# Patient Record
Sex: Female | Born: 1990 | Race: Asian | Hispanic: No | Smoking: Never smoker
Health system: Southern US, Community
[De-identification: ages and names within clinical notes are randomized; demographics above are authoritative.]

## PROBLEM LIST (undated history)

## (undated) DIAGNOSIS — Z789 Other specified health status: Secondary | ICD-10-CM

## (undated) HISTORY — PX: NO PAST SURGERIES: SHX2092

---
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
## 2012-06-08 LAB — OB RESULTS CONSOLE RPR: RPR: NONREACTIVE

## 2012-06-08 LAB — OB RESULTS CONSOLE HEPATITIS B SURFACE ANTIGEN: Hepatitis B Surface Ag: NEGATIVE

## 2012-06-08 LAB — OB RESULTS CONSOLE GC/CHLAMYDIA: Chlamydia: NEGATIVE

## 2012-09-05 NOTE — L&D Delivery Note (Signed)
Delivery Note At 8:35 PM a viable female was delivered via  (Presentation: ;  ).  APGAR: , ; weight .   Placenta status: , .  Cord:  with the following complications: .  Cord pH: not done  Anesthesia:   Episiotomy:  Lacerations:  Suture Repair: 2.0 vicryl Est. Blood Loss (mL):   Mom to postpartum.  Baby to nursery-stable.  Fumio Vandam A 12/10/2012, 8:52 PM

## 2012-11-21 ENCOUNTER — Encounter: Payer: Self-pay | Admitting: Obstetrics

## 2012-11-21 ENCOUNTER — Ambulatory Visit (INDEPENDENT_AMBULATORY_CARE_PROVIDER_SITE_OTHER): Payer: Medicaid Other | Admitting: Obstetrics

## 2012-11-21 DIAGNOSIS — Z3480 Encounter for supervision of other normal pregnancy, unspecified trimester: Secondary | ICD-10-CM

## 2012-11-21 DIAGNOSIS — Z3403 Encounter for supervision of normal first pregnancy, third trimester: Secondary | ICD-10-CM

## 2012-11-21 DIAGNOSIS — Z3400 Encounter for supervision of normal first pregnancy, unspecified trimester: Secondary | ICD-10-CM

## 2012-11-21 LAB — POCT URINALYSIS DIPSTICK
Bilirubin, UA: NEGATIVE
Blood, UA: NEGATIVE
Ketones, UA: NEGATIVE
Leukocytes, UA: NEGATIVE
Spec Grav, UA: 1.015
pH, UA: 8

## 2012-11-21 MED ORDER — LORATADINE-PSEUDOEPHEDRINE ER 5-120 MG PO TB12: 1.0000 | ORAL_TABLET | Freq: Two times a day (BID) | ORAL | Status: DC

## 2012-11-28 ENCOUNTER — Ambulatory Visit (INDEPENDENT_AMBULATORY_CARE_PROVIDER_SITE_OTHER): Payer: Medicaid Other | Admitting: Obstetrics

## 2012-11-28 ENCOUNTER — Encounter: Payer: Self-pay | Admitting: Obstetrics

## 2012-11-28 DIAGNOSIS — Z3400 Encounter for supervision of normal first pregnancy, unspecified trimester: Secondary | ICD-10-CM

## 2012-11-28 DIAGNOSIS — Z3403 Encounter for supervision of normal first pregnancy, third trimester: Secondary | ICD-10-CM

## 2012-12-05 ENCOUNTER — Ambulatory Visit (INDEPENDENT_AMBULATORY_CARE_PROVIDER_SITE_OTHER): Payer: Medicaid Other | Admitting: Obstetrics & Gynecology

## 2012-12-05 ENCOUNTER — Encounter: Payer: Self-pay | Admitting: Obstetrics & Gynecology

## 2012-12-05 DIAGNOSIS — Z3403 Encounter for supervision of normal first pregnancy, third trimester: Secondary | ICD-10-CM

## 2012-12-05 DIAGNOSIS — Z3400 Encounter for supervision of normal first pregnancy, unspecified trimester: Secondary | ICD-10-CM

## 2012-12-05 NOTE — Progress Notes (Signed)
C/O BRBPR w/BM.

## 2012-12-05 NOTE — Patient Instructions (Signed)
Hemorrhoids  Hemorrhoids are veins in the rectum that get big. These veins can get blocked. Blocked veins become puffy (swollen) and painful.  HOME CARE   Eat more fiber.   Drink enough fluid to keep your pee (urine) clear or pale yellow.   Exercise often.   Avoid straining to poop (bowel movement).   Keep the butt area dry and clean.   Only take medicine as told by your doctor.  If your hemorrhoids are puffy and painful:   Take a warm bath for 20 to 30 minutes. Do this 3 to 4 times a day.   Place ice packs on the area. Use the ice packs between the baths.   Put ice in a plastic bag.   Place a towel between your skin and the bag.   Leave the ice on for 15 to 20 minutes, 3 to 4 times a day.   Do not use a donut-shaped pillow. Do not sit on the toilet for a long time.   Go to the bathroom when your body has the urge to poop. This is so you do not strain as much to poop.  GET HELP RIGHT AWAY IF:     You have increasing pain that is not controlled with medicine.   You have uncontrolled bleeding.   You cannot poop.   You have pain or puffiness outside the area of the hemorrhoids.   You have chills.   You have a temperature by mouth above 102 F (38.9 C), not controlled by medicine.  MAKE SURE YOU:     Understand these instructions.   Will watch your condition.   Will get help right away if you are not doing well or get worse.  Document Released: 05/31/2008 Document Revised: 11/14/2011 Document Reviewed: 05/31/2008  [employer] Patient Information 2013 [employer].

## 2012-12-10 ENCOUNTER — Encounter (HOSPITAL_COMMUNITY): Payer: Self-pay | Admitting: *Deleted

## 2012-12-10 ENCOUNTER — Inpatient Hospital Stay (HOSPITAL_COMMUNITY)
Admission: AD | Admit: 2012-12-10 | Discharge: 2012-12-12 | DRG: 775 | Disposition: A | Discharge status: Home / Self Care | Payer: Medicaid Other | Source: Ambulatory Visit | Attending: Obstetrics & Gynecology | Admitting: Obstetrics & Gynecology

## 2012-12-10 DIAGNOSIS — IMO0001 Reserved for inherently not codable concepts without codable children: Secondary | ICD-10-CM

## 2012-12-10 HISTORY — DX: Other specified health status: Z78.9

## 2012-12-10 LAB — CBC
Hemoglobin: 12.1 g/dL (ref 12.0–15.0)
MCH: 22.8 pg — ABNORMAL LOW (ref 26.0–34.0)
RBC: 5.3 MIL/uL — ABNORMAL HIGH (ref 3.87–5.11)

## 2012-12-10 LAB — RPR: RPR Ser Ql: NONREACTIVE

## 2012-12-10 LAB — ABO/RH: ABO/RH(D): O POS

## 2012-12-10 LAB — TYPE AND SCREEN
ABO/RH(D): O POS
Antibody Screen: NEGATIVE

## 2012-12-10 MED ORDER — ZOLPIDEM TARTRATE 5 MG PO TABS: 5.0000 mg | ORAL_TABLET | Freq: Every evening | ORAL | Status: DC | PRN

## 2012-12-10 MED ORDER — SIMETHICONE 80 MG PO CHEW: 80.0000 mg | CHEWABLE_TABLET | ORAL | Status: DC | PRN

## 2012-12-10 MED ORDER — OXYCODONE-ACETAMINOPHEN 5-325 MG PO TABS
1.0000 | ORAL_TABLET | ORAL | Status: DC | PRN
  Administered 2012-12-11: 1 via ORAL

## 2012-12-10 MED ORDER — ACETAMINOPHEN 325 MG PO TABS: 650.0000 mg | ORAL_TABLET | ORAL | Status: DC | PRN

## 2012-12-10 MED ORDER — DIBUCAINE 1 % RE OINT: 1.0000 "application " | TOPICAL_OINTMENT | RECTAL | Status: DC | PRN

## 2012-12-10 MED ORDER — LACTATED RINGERS IV SOLN: 500.0000 mL | INTRAVENOUS | Status: DC | PRN

## 2012-12-10 MED ORDER — OXYTOCIN 40 UNITS IN LACTATED RINGERS INFUSION - SIMPLE MED: 62.5000 mL/h | INTRAVENOUS | Status: DC

## 2012-12-10 MED ORDER — ONDANSETRON HCL 4 MG/2ML IJ SOLN: 4.0000 mg | INTRAMUSCULAR | Status: DC | PRN

## 2012-12-10 MED ORDER — BUTORPHANOL TARTRATE 1 MG/ML IJ SOLN
2.0000 mg | INTRAMUSCULAR | Status: DC | PRN
  Administered 2012-12-10: 2 mg via INTRAVENOUS
  Filled 2012-12-10: qty 2

## 2012-12-10 MED ORDER — LANOLIN HYDROUS EX OINT: TOPICAL_OINTMENT | CUTANEOUS | Status: DC | PRN

## 2012-12-10 MED ORDER — OXYCODONE-ACETAMINOPHEN 5-325 MG PO TABS
1.0000 | ORAL_TABLET | ORAL | Status: DC | PRN
  Filled 2012-12-10: qty 1

## 2012-12-10 MED ORDER — TETANUS-DIPHTH-ACELL PERTUSSIS 5-2.5-18.5 LF-MCG/0.5 IM SUSP
0.5000 mL | Freq: Once | INTRAMUSCULAR | Status: AC
  Administered 2012-12-12: 0.5 mL via INTRAMUSCULAR

## 2012-12-10 MED ORDER — LIDOCAINE HCL (PF) 1 % IJ SOLN
30.0000 mL | INTRAMUSCULAR | Status: DC | PRN
  Administered 2012-12-10: 30 mL via SUBCUTANEOUS
  Filled 2012-12-10 (×2): qty 30

## 2012-12-10 MED ORDER — PRENATAL MULTIVITAMIN CH
1.0000 | ORAL_TABLET | Freq: Every day | ORAL | Status: DC
  Administered 2012-12-11: 1 via ORAL
  Filled 2012-12-10: qty 1

## 2012-12-10 MED ORDER — FERROUS SULFATE 325 (65 FE) MG PO TABS
325.0000 mg | ORAL_TABLET | Freq: Two times a day (BID) | ORAL | Status: DC
  Administered 2012-12-11 – 2012-12-12 (×3): 325 mg via ORAL
  Filled 2012-12-10 (×3): qty 1

## 2012-12-10 MED ORDER — IBUPROFEN 600 MG PO TABS
600.0000 mg | ORAL_TABLET | Freq: Four times a day (QID) | ORAL | Status: DC
  Administered 2012-12-11 – 2012-12-12 (×5): 600 mg via ORAL
  Filled 2012-12-10: qty 1

## 2012-12-10 MED ORDER — OXYTOCIN 40 UNITS IN LACTATED RINGERS INFUSION - SIMPLE MED
1.0000 m[IU]/min | INTRAVENOUS | Status: DC
  Administered 2012-12-10: 2 m[IU]/min via INTRAVENOUS
  Filled 2012-12-10: qty 1000

## 2012-12-10 MED ORDER — ONDANSETRON HCL 4 MG PO TABS: 4.0000 mg | ORAL_TABLET | ORAL | Status: DC | PRN

## 2012-12-10 MED ORDER — TERBUTALINE SULFATE 1 MG/ML IJ SOLN: 0.2500 mg | Freq: Once | INTRAMUSCULAR | Status: DC | PRN

## 2012-12-10 MED ORDER — CITRIC ACID-SODIUM CITRATE 334-500 MG/5ML PO SOLN: 30.0000 mL | ORAL | Status: DC | PRN

## 2012-12-10 MED ORDER — LACTATED RINGERS IV SOLN
INTRAVENOUS | Status: DC
  Administered 2012-12-10: 20:00:00 via INTRAVENOUS
  Administered 2012-12-10: 125 mL/h via INTRAVENOUS

## 2012-12-10 MED ORDER — DIPHENHYDRAMINE HCL 25 MG PO CAPS: 25.0000 mg | ORAL_CAPSULE | Freq: Four times a day (QID) | ORAL | Status: DC | PRN

## 2012-12-10 MED ORDER — ONDANSETRON HCL 4 MG/2ML IJ SOLN: 4.0000 mg | Freq: Four times a day (QID) | INTRAMUSCULAR | Status: DC | PRN

## 2012-12-10 MED ORDER — WITCH HAZEL-GLYCERIN EX PADS: 1.0000 "application " | MEDICATED_PAD | CUTANEOUS | Status: DC | PRN

## 2012-12-10 MED ORDER — BENZOCAINE-MENTHOL 20-0.5 % EX AERO
1.0000 "application " | INHALATION_SPRAY | CUTANEOUS | Status: DC | PRN
  Filled 2012-12-10: qty 56

## 2012-12-10 MED ORDER — OXYTOCIN BOLUS FROM INFUSION
500.0000 mL | INTRAVENOUS | Status: DC
  Administered 2012-12-10: 500 mL via INTRAVENOUS

## 2012-12-10 MED ORDER — SENNOSIDES-DOCUSATE SODIUM 8.6-50 MG PO TABS
2.0000 | ORAL_TABLET | Freq: Every day | ORAL | Status: DC
  Administered 2012-12-11: 2 via ORAL

## 2012-12-10 MED ORDER — IBUPROFEN 600 MG PO TABS
600.0000 mg | ORAL_TABLET | Freq: Four times a day (QID) | ORAL | Status: DC | PRN
  Administered 2012-12-10: 600 mg via ORAL
  Filled 2012-12-10 (×5): qty 1

## 2012-12-10 NOTE — H&P (Signed)
[name] is a 22 y.o. female presenting for contractions. Maternal Medical History:  Reason for admission: Contractions.   Contractions: Frequency: regular.   Perceived severity is moderate.    Fetal activity: Perceived fetal activity is normal.    Prenatal complications: no prenatal complications   OB History   Grav Para Term Preterm Abortions TAB SAB Ect Mult Living   1 0 0 0 0 0 0 0 0 0      Past Medical History  Diagnosis Date  . Medical history non-contributory    Past Surgical History  Procedure Laterality Date  . No past surgeries     Family History: family history is negative for Hearing loss. Social History:  reports that she has never smoked. She has never used smokeless tobacco. She reports that she does not drink alcohol or use illicit drugs.   Prenatal Transfer Tool  Maternal Review of Systems  Constitutional: Negative for fever.  Eyes: Negative for blurred vision.  Respiratory: Negative for shortness of breath.   Gastrointestinal: Negative for vomiting.  Skin: Negative for rash.  Neurological: Negative for headaches.    Dilation: 4 Effacement (%): 100 Station: -1;-2 Exam by:: jolynn Blood pressure 111/65, pulse 83, temperature 97.5 F (36.4 C), temperature source Oral, resp. rate 20, height 5\' 2"  (1.575 m), weight 61.689 kg (136 lb). Maternal Exam:  Abdomen: not evaluated.  Introitus: not evaluated.   Cervix: Cervix evaluated by digital exam.     Fetal Exam Fetal Monitor Review: Variability: moderate (6-25 bpm).   Pattern: accelerations present and no decelerations.    Fetal State Assessment: Category I - tracings are normal.     Physical Exam  Constitutional: She appears well-developed.  HENT:  Head: Normocephalic.  Neck: Neck supple. No thyromegaly present.  Cardiovascular: Normal rate and regular rhythm.   Respiratory: Breath sounds normal.  GI: Soft. Bowel sounds are normal.  Skin: No rash noted.    Prenatal labs: ABO, Rh:  --/--/O POS, O POS (04/07 1610) Antibody: NEG (04/07 0955) Rubella: Immune (10/04 0000) RPR: Nonreactive (10/04 0000)  HBsAg: Negative (10/04 0000)  HIV: Non-reactive (10/04 0000)  GBS: Negative (03/18 0000)   Assessment/Plan: Nullipara at term, active labor, Category 1 FHT Admit, anticipate an NSVD   [name],Geryl Dohn A 12/10/2012, 1:22 PM

## 2012-12-10 NOTE — Progress Notes (Signed)
Sandra [name] is [name] 22 y.o. G1P0000 at [redacted]w[redacted]d by LMP admitted for active labor  Subjective: Uncomfortable  Objective: BP 105/54  Pulse 78  Temp(Src) 97.3 F (36.3 C) (Axillary)  Resp 18  Ht 5\' 2"  (1.575 m)  Wt 61.689 kg (136 lb)  BMI 24.87 kg/m2      FHT:  FHR: 140 bpm, variability: moderate,  accelerations:  Present,  decelerations:  Absent UC:   irregular, every 5 minutes SVE:   Dilation: 6 Effacement (%): 100 Station: 0 Exam by:: Dr. Tamela Oddi  Labs: Lab Results  Component Value Date   WBC 14.4* 12/10/2012   HGB 12.1 12/10/2012   HCT 35.4* 12/10/2012   MCV 66.8* 12/10/2012   PLT 238 12/10/2012    Assessment / Plan: Late latent,early active labor  Labor: will start Pitocin Preeclampsia:  n/[name] Fetal Wellbeing:  Category I Pain Control:  Labor support without medications I/D:  n/[name] Anticipated MOD:  NSVD  [name],Sandra [name] 12/10/2012, 5:52 PM

## 2012-12-10 NOTE — MAU Note (Signed)
PT G1 at 39.6wks having contractions since 0600.  5 contractions in an hour.  Denies leaking or bleeding.

## 2012-12-10 NOTE — MAU Note (Signed)
Ctx's started this morning around 0600, due tomorrow.  No bleeding or leaking.

## 2012-12-11 ENCOUNTER — Encounter: Payer: Self-pay | Admitting: Obstetrics & Gynecology

## 2012-12-11 LAB — CBC
MCHC: 33.6 g/dL (ref 30.0–36.0)
RDW: 16.4 % — ABNORMAL HIGH (ref 11.5–15.5)
WBC: 19.8 10*3/uL — ABNORMAL HIGH (ref 4.0–10.5)

## 2012-12-11 LAB — CCBB MATERNAL DONOR DRAW

## 2012-12-11 NOTE — Progress Notes (Signed)
Post Partum Day 1 Subjective: no complaints and tolerating PO  Objective: Blood pressure 117/78, pulse 110, temperature 98.7 F (37.1 C), temperature source Axillary, resp. rate 18, height 5\' 2"  (1.575 m), weight 136 lb (61.689 kg), unknown if currently breastfeeding.  Physical Exam:  General: alert and no distress Lochia: appropriate Uterine Fundus: firm Incision: none DVT Evaluation: No evidence of DVT seen on physical exam.   Recent Labs  12/10/12 0955 12/11/12 0600  HGB 12.1 10.2*  HCT 35.4* 30.4*    Assessment/Plan: Plan for discharge tomorrow   LOS: 1 day   HARPER,CHARLES A 12/11/2012, 8:13 AM

## 2012-12-11 NOTE — Progress Notes (Signed)
UR chart review completed.  

## 2012-12-12 ENCOUNTER — Encounter: Payer: Medicaid Other | Admitting: Obstetrics & Gynecology

## 2012-12-12 MED ORDER — OXYCODONE-ACETAMINOPHEN 5-325 MG PO TABS: 1.0000 | ORAL_TABLET | ORAL | Status: DC | PRN

## 2012-12-12 MED ORDER — IBUPROFEN 600 MG PO TABS: 600.0000 mg | ORAL_TABLET | Freq: Four times a day (QID) | ORAL | Status: DC | PRN

## 2012-12-12 NOTE — Progress Notes (Signed)
Post Partum Day 2 Subjective: no complaints  Objective: Blood pressure 174/59, pulse 79, temperature 98.4 F (36.9 C), temperature source Oral, resp. rate 20, height 5\' 2"  (1.575 m), weight 136 lb (61.689 kg), unknown if currently breastfeeding.  Physical Exam:  General: alert and no distress Lochia: appropriate Uterine Fundus: firm Incision: healing well DVT Evaluation: No evidence of DVT seen on physical exam.   Recent Labs  12/10/12 0955 12/11/12 0600  HGB 12.1 10.2*  HCT 35.4* 30.4*    Assessment/Plan: Discharge home   LOS: 2 days   HARPER,CHARLES A 12/12/2012, 7:21 AM

## 2012-12-12 NOTE — Discharge Summary (Signed)
Obstetric Discharge Summary Reason for Admission: onset of labor Prenatal Procedures: ultrasound Intrapartum Procedures: spontaneous vaginal delivery Postpartum Procedures: none Complications-Operative and Postpartum: none Hemoglobin  Date Value Range Status  12/11/2012 10.2* 12.0 - 15.0 g/dL Final     HCT  Date Value Range Status  12/11/2012 30.4* 36.0 - 46.0 % Final    Physical Exam:  General: alert and no distress Lochia: appropriate Uterine Fundus: firm Incision: healing well DVT Evaluation: No evidence of DVT seen on physical exam.  Discharge Diagnoses: Term Pregnancy-delivered  Discharge Information: Date: 12/12/2012 Activity: pelvic rest Diet: routine Medications: PNV, Ibuprofen, Colace and Percocet Condition: stable Instructions: refer to practice specific booklet Discharge to: home Follow-up Information   Follow up with Antionette Char A, MD. Schedule an appointment as soon as possible for a visit in 6 weeks.   Contact information:   [address] [place] [address] [phone]       Newborn Data: Live born female  Birth Weight: 7 lb 10.8 oz (3481 g) APGAR: 5, 6  Home with mother.  Teion Ballin A 12/12/2012, 7:25 AM
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
## 2013-01-24 ENCOUNTER — Ambulatory Visit (INDEPENDENT_AMBULATORY_CARE_PROVIDER_SITE_OTHER): Payer: Medicaid Other | Admitting: Obstetrics & Gynecology

## 2013-01-24 VITALS — BP 122/64 | HR 64 | Temp 97.4°F | Wt 116.0 lb

## 2013-01-24 DIAGNOSIS — Z3202 Encounter for pregnancy test, result negative: Secondary | ICD-10-CM

## 2013-01-24 DIAGNOSIS — IMO0001 Reserved for inherently not codable concepts without codable children: Secondary | ICD-10-CM

## 2013-01-24 MED ORDER — NORETHINDRONE 0.35 MG PO TABS: 1.0000 | ORAL_TABLET | Freq: Every day | ORAL | Status: DC

## 2013-01-24 NOTE — Progress Notes (Signed)
Subjective:     [name] is a 22 y.o. female who presents for a postpartum visit. She is 6 weeks postpartum following a spontaneous vaginal delivery. I have fully reviewed the prenatal and intrapartum course. The delivery was at 40 gestational weeks. Outcome: spontaneous vaginal delivery. Anesthesia: none. Postpartum course has been normal. Baby's course has been normal. Baby is feeding by breast. Bleeding no bleeding. Bowel function is normal. Bladder function is normal. Patient is sexually active. Contraception method is condoms. Postpartum depression screening: 13 risk for post partum depression.  The following portions of the patient's history were reviewed and updated as appropriate: allergies, current medications, past family history, past medical history, past social history, past surgical history and problem list.  Review of Systems Pertinent items are noted in HPI.   Objective:    BP 122/64  Pulse 64  Temp(Src) 97.4 F (36.3 C)  Wt 116 lb (52.617 kg)  BMI 21.21 kg/m2  Breastfeeding? Yes        General:  alert     Abdomen: soft, non-tender; bowel sounds normal; no masses,  no organomegaly   Vulva:  normal  Vagina: normal vagina  Cervix:  no lesions  Corpus: normal size, contour, position, consistency, mobility, non-tender  Adnexa:  normal adnexa   Assessment:     Normal postpartum exam. Considering a Nexplanon for contraception  Plan:    Contraception: oral progesterone-only contraceptive  Follow up as needed.

## 2013-01-24 NOTE — Patient Instructions (Addendum)

## 2013-01-25 ENCOUNTER — Encounter: Payer: Self-pay | Admitting: Obstetrics & Gynecology

## 2013-01-30 ENCOUNTER — Ambulatory Visit: Payer: Medicaid Other | Admitting: Obstetrics & Gynecology

## 2013-02-13 ENCOUNTER — Ambulatory Visit (INDEPENDENT_AMBULATORY_CARE_PROVIDER_SITE_OTHER): Payer: Medicaid Other | Admitting: Obstetrics & Gynecology

## 2013-02-13 ENCOUNTER — Encounter: Payer: Self-pay | Admitting: Obstetrics & Gynecology

## 2013-02-13 VITALS — BP 96/61 | HR 55 | Temp 97.5°F | Wt 113.0 lb

## 2013-02-13 DIAGNOSIS — Z30017 Encounter for initial prescription of implantable subdermal contraceptive: Secondary | ICD-10-CM

## 2013-02-13 DIAGNOSIS — Z3202 Encounter for pregnancy test, result negative: Secondary | ICD-10-CM

## 2013-02-13 LAB — POCT URINE PREGNANCY: Preg Test, Ur: NEGATIVE

## 2013-02-13 MED ORDER — ETONOGESTREL 68 MG ~~LOC~~ IMPL: 68.0000 mg | DRUG_IMPLANT | Freq: Once | SUBCUTANEOUS | Status: DC

## 2013-02-13 NOTE — Progress Notes (Signed)
NEXPLANON INSERTION NOTE  Date of LMP:   01/26/13  Contraception used: Hormonal Contraception: Injection, Rings and Patches  Pregnancy test result:  Lab Results  Component Value Date   PREGTESTUR  Value: NEGATIVE        THE SENSITIVITY OF THIS METHODOLOGY IS >24 mIU/mL 04/08/2007    Indications:  The patient desires contraception.  She understands risks, benefits, and alternatives to Implanon and would like to proceed.  Anesthesia:   Lidocaine 1% plain.  Procedure:  A time-out was performed confirming the procedure and the patient's allergy status.  The patient's non-dominant was identified as the left arm.  The protection cap was removed. While placing countertraction on the skin, the needle was inserted at a 30 degree angle.  The applicator was held horizontal to the skin; the skin was tented upward as the needle was introduced into the subdermal space.  While holding the applicator in place, the slider was unlocked. The Nexplanon was removed from the field.  The Nexplanon was palpated to ensure proper placement.  Complications: None  Instructions:  The patient was instructed to remove the dressing in 24 hours and that some bruising is to be expected.  She was advised to use over the counter analgesics as needed for any pain at the site.  She is to keep the area dry for 24 hours and to call if her hand or arm becomes cold, numb, or blue.  Return visit:  Return in 6+ weeks

## 2013-02-13 NOTE — Patient Instructions (Signed)

## 2013-02-14 ENCOUNTER — Encounter: Payer: Self-pay | Admitting: Obstetrics & Gynecology

## 2013-02-14 DIAGNOSIS — Z30017 Encounter for initial prescription of implantable subdermal contraceptive: Secondary | ICD-10-CM | POA: Insufficient documentation

## 2013-03-07 ENCOUNTER — Ambulatory Visit: Payer: Medicaid Other | Admitting: Obstetrics & Gynecology

## 2013-07-11 ENCOUNTER — Other Ambulatory Visit: Payer: Self-pay

## 2014-07-07 ENCOUNTER — Encounter: Payer: Self-pay | Admitting: Obstetrics & Gynecology

## 2014-09-01 ENCOUNTER — Encounter: Payer: Self-pay | Admitting: *Deleted

## 2014-09-02 ENCOUNTER — Encounter: Payer: Self-pay | Admitting: Obstetrics & Gynecology

## 2016-01-29 ENCOUNTER — Encounter: Payer: Self-pay | Admitting: *Deleted

## 2016-01-29 ENCOUNTER — Encounter: Payer: Self-pay | Admitting: Obstetrics

## 2016-01-29 ENCOUNTER — Ambulatory Visit (INDEPENDENT_AMBULATORY_CARE_PROVIDER_SITE_OTHER): Payer: 59 | Admitting: Obstetrics

## 2016-01-29 VITALS — BP 92/64 | HR 67 | Wt 107.0 lb

## 2016-01-29 DIAGNOSIS — Z01419 Encounter for gynecological examination (general) (routine) without abnormal findings: Secondary | ICD-10-CM | POA: Diagnosis not present
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
## 2016-02-02 ENCOUNTER — Encounter: Payer: Self-pay | Admitting: Obstetrics

## 2016-02-02 LAB — NUSWAB VG+, CANDIDA 6SP
Candida albicans, NAA: NEGATIVE
Candida glabrata, NAA: NEGATIVE
Candida krusei, NAA: NEGATIVE
Candida lusitaniae, NAA: NEGATIVE
Candida parapsilosis, NAA: NEGATIVE
Candida tropicalis, NAA: NEGATIVE
Chlamydia trachomatis, NAA: NEGATIVE
Neisseria gonorrhoeae, NAA: NEGATIVE
Trich vag by NAA: NEGATIVE

## 2016-02-05 LAB — PAP IG W/ RFLX HPV ASCU: PAP SMEAR COMMENT: 0

## 2016-02-05 LAB — HPV DNA PROBE HIGH RISK, AMPLIFIED: HPV, high-risk: NEGATIVE

## 2016-02-18 ENCOUNTER — Encounter: Payer: Self-pay | Admitting: Obstetrics

## 2016-02-18 ENCOUNTER — Ambulatory Visit (INDEPENDENT_AMBULATORY_CARE_PROVIDER_SITE_OTHER): Payer: 59 | Admitting: Obstetrics

## 2016-02-18 VITALS — BP 91/60 | HR 77 | Temp 98.3°F | Wt 111.0 lb

## 2016-02-18 DIAGNOSIS — Z3049 Encounter for surveillance of other contraceptives: Secondary | ICD-10-CM

## 2016-02-18 DIAGNOSIS — Z3046 Encounter for surveillance of implantable subdermal contraceptive: Secondary | ICD-10-CM

## 2016-02-18 DIAGNOSIS — Z3169 Encounter for other general counseling and advice on procreation: Secondary | ICD-10-CM

## 2016-02-18 MED ORDER — PNV PRENATAL PLUS MULTIVITAMIN 27-1 MG PO TABS: 1.0000 | ORAL_TABLET | Freq: Every day | ORAL | Status: DC

## 2016-02-18 NOTE — Progress Notes (Signed)

## 2016-03-03 ENCOUNTER — Ambulatory Visit (INDEPENDENT_AMBULATORY_CARE_PROVIDER_SITE_OTHER): Payer: 59 | Admitting: Obstetrics

## 2016-03-03 ENCOUNTER — Encounter: Payer: Self-pay | Admitting: Obstetrics

## 2016-03-03 VITALS — BP 94/56 | HR 73 | Wt 110.0 lb

## 2016-03-03 DIAGNOSIS — Z9889 Other specified postprocedural states: Secondary | ICD-10-CM

## 2016-03-03 DIAGNOSIS — Z3009 Encounter for other general counseling and advice on contraception: Secondary | ICD-10-CM

## 2016-03-03 NOTE — Progress Notes (Signed)
   Subjective:        [name] is a 25 y.o. female here for a routine exam.  2 weeks post op Nexplanon removal.  Current complaints: None.     Obstetric History OB History  Gravida Para Term Preterm AB SAB TAB Ectopic Multiple Living  1 1 1  0 0 0 0 0 0 1    # Outcome Date GA Lbr Len/2nd Weight Sex Delivery Anes PTL Lv  1 Term 12/10/12 [id number] 14:21 / 00:14 7 lb 10.8 oz (3.481 kg) M Vag-Spont Local  Y      Past Medical History  Diagnosis Date  . Medical history non-contributory     Past Surgical History  Procedure Laterality Date  . No past surgeries       Current outpatient prescriptions:  .  ibuprofen (ADVIL,MOTRIN) 600 MG tablet, Take 1 tablet (600 mg total) by mouth every 6 (six) hours as needed for pain (pain scale < 4)., Disp: 30 tablet, Rfl: 5 .  Prenatal Vit-Fe Fumarate-FA (PNV PRENATAL PLUS MULTIVITAMIN) 27-1 MG TABS, Take 1 tablet by mouth daily before breakfast., Disp: 30 tablet, Rfl: 11  Current facility-administered medications:  .  etonogestrel (IMPLANON) implant 68 mg, 68 mg, Subcutaneous, Once, Antionette CharLisa [name], MD No Known Allergies  Social History  Substance Use Topics  . Smoking status: Never Smoker   . Smokeless tobacco: Never Used  . Alcohol Use: No    Family History  Problem Relation Age of Onset  . Hearing loss Neg Hx       Review of Systems  Constitutional: negative for fatigue and weight loss Respiratory: negative for cough and wheezing Cardiovascular: negative for chest pain, fatigue and palpitations Gastrointestinal: negative for abdominal pain and change in bowel habits Musculoskeletal:negative for myalgias Neurological: negative for gait problems and tremors Behavioral/Psych: negative for abusive relationship, depression Endocrine: negative for temperature intolerance   Genitourinary:negative for abnormal menstrual periods, genital lesions, hot flashes, sexual problems and vaginal discharge Integument/breast: negative for breast  lump, breast tenderness, nipple discharge and skin lesion(s)    Objective:       BP 94/56 mmHg  Pulse 73  Wt 110 lb (49.896 kg)   PE:      General:  Alert and no distress      Left arm:  Nexplanon removal site clean, non tender, intact.  Lab Review Urine pregnancy test Labs reviewed yes Radiologic studies reviewed no    Assessment:    Doing well after Nexplanon.  Incision healing well.  Contraceptive counseling and advice.   Plan:    Education reviewed: calcium supplements, low fat, low cholesterol diet, safe sex/STD prevention and weight bearing exercise. Contraception: Options discussed. Follow up in: several months.   No orders of the defined types were placed in this encounter.   No orders of the defined types were placed in this encounter.

## 2016-04-19 ENCOUNTER — Other Ambulatory Visit: Payer: 59

## 2016-04-19 ENCOUNTER — Ambulatory Visit (INDEPENDENT_AMBULATORY_CARE_PROVIDER_SITE_OTHER): Payer: 59 | Admitting: *Deleted

## 2016-04-19 DIAGNOSIS — N912 Amenorrhea, unspecified: Secondary | ICD-10-CM

## 2016-04-19 DIAGNOSIS — Z3201 Encounter for pregnancy test, result positive: Secondary | ICD-10-CM

## 2016-04-19 LAB — POCT URINE PREGNANCY: Preg Test, Ur: POSITIVE — AB

## 2016-05-19 ENCOUNTER — Encounter: Payer: 59 | Admitting: Obstetrics

## 2016-05-22 ENCOUNTER — Encounter: Payer: Self-pay | Admitting: *Deleted

## 2016-05-22 DIAGNOSIS — Z3490 Encounter for supervision of normal pregnancy, unspecified, unspecified trimester: Secondary | ICD-10-CM | POA: Insufficient documentation

## 2016-05-23 ENCOUNTER — Ambulatory Visit (INDEPENDENT_AMBULATORY_CARE_PROVIDER_SITE_OTHER): Payer: 59 | Admitting: Obstetrics

## 2016-05-23 ENCOUNTER — Encounter: Payer: Self-pay | Admitting: Obstetrics

## 2016-05-23 VITALS — BP 97/64 | HR 83 | Temp 98.0°F | Wt 110.5 lb

## 2016-05-23 DIAGNOSIS — Z3480 Encounter for supervision of other normal pregnancy, unspecified trimester: Secondary | ICD-10-CM | POA: Diagnosis not present

## 2016-05-23 DIAGNOSIS — Z30017 Encounter for initial prescription of implantable subdermal contraceptive: Secondary | ICD-10-CM

## 2016-05-23 DIAGNOSIS — Z3687 Encounter for antenatal screening for uncertain dates: Secondary | ICD-10-CM

## 2016-05-23 NOTE — Progress Notes (Signed)
Subjective:    [name] is being seen today for her first obstetrical visit.  This is a planned pregnancy. She is at Unknown gestation. Her obstetrical history is significant for noe. Relationship with FOB: significant other, living together. Patient does intend to breast feed. Pregnancy history fully reviewed.  The information documented in the HPI was reviewed and verified.  Menstrual History: OB History    Gravida Para Term Preterm AB Living   2 1 1  0 0 1   SAB TAB Ectopic Multiple Live Births   0 0 0 0 1       No LMP recorded (lmp unknown). Patient is pregnant.    Past Medical History:  Diagnosis Date  . Medical history non-contributory     Past Surgical History:  Procedure Laterality Date  . NO PAST SURGERIES       (Not in a hospital admission) No Known Allergies  Social History  Substance Use Topics  . Smoking status: Never Smoker  . Smokeless tobacco: Never Used  . Alcohol use No    Family History  Problem Relation Age of Onset  . Hearing loss Neg Hx      Review of Systems Constitutional: negative for weight loss Gastrointestinal: negative for vomiting Genitourinary:negative for genital lesions and vaginal discharge and dysuria Musculoskeletal:negative for back pain Behavioral/Psych: negative for abusive relationship, depression, illegal drug usage and tobacco use    Objective:    BP 97/64   Pulse 83   Temp 98 F (36.7 C)   Wt 110 lb 8 oz (50.1 kg)   LMP  (LMP Unknown) Comment: Nexplanon removed 02/18/16.  BMI 20.21 kg/m  General Appearance:    Alert, cooperative, no distress, appears stated age  Head:    Normocephalic, without obvious abnormality, atraumatic  Eyes:    PERRL, conjunctiva/corneas clear, EOM's intact, fundi    benign, both eyes  Ears:    Normal TM's and external ear canals, both ears  Nose:   Nares normal, septum midline, mucosa normal, no drainage    or sinus tenderness  Throat:   Lips, mucosa, and tongue normal; teeth and gums  normal  Neck:   Supple, symmetrical, trachea midline, no adenopathy;    thyroid:  no enlargement/tenderness/nodules; no carotid   bruit or JVD  Back:     Symmetric, no curvature, ROM normal, no CVA tenderness  Lungs:     Clear to auscultation bilaterally, respirations unlabored  Chest Wall:    No tenderness or deformity   Heart:    Regular rate and rhythm, S1 and S2 normal, no murmur, rub   or gallop  Breast Exam:    No tenderness, masses, or nipple abnormality  Abdomen:     Soft, non-tender, bowel sounds active all four quadrants,    no masses, no organomegaly  Genitalia:    Normal female without lesion, discharge or tenderness  Extremities:   Extremities normal, atraumatic, no cyanosis or edema  Pulses:   2+ and symmetric all extremities  Skin:   Skin color, texture, turgor normal, no rashes or lesions  Lymph nodes:   Cervical, supraclavicular, and axillary nodes normal  Neurologic:   CNII-XII intact, normal strength, sensation and reflexes    throughout      Lab Review Urine pregnancy test Labs reviewed yes Radiologic studies reviewed no Assessment:    Pregnancy at Unknown weeks      Plan:     Ultrasound ordered for dating Prenatal vitamins.  Counseling provided regarding continued use of  seat belts, cessation of alcohol consumption, smoking or use of illicit drugs; infection precautions i.e., influenza/TDAP immunizations, toxoplasmosis,CMV, parvovirus, listeria and varicella; workplace safety, exercise during pregnancy; routine dental care, safe medications, sexual activity, hot tubs, saunas, pools, travel, caffeine use, fish and methlymercury, potential toxins, hair treatments, varicose veins Weight gain recommendations per IOM guidelines reviewed: underweight/BMI< 18.5--> gain 28 - 40 lbs; normal weight/BMI 18.5 - 24.9--> gain 25 - 35 lbs; overweight/BMI 25 - 29.9--> gain 15 - 25 lbs; obese/BMI >30->gain  11 - 20 lbs Problem list reviewed and updated. FIRST/CF mutation  testing/NIPT/QUAD SCREEN/fragile X/[name] population testing/Spinal muscular atrophy discussed: requested. Role of ultrasound in pregnancy discussed; fetal survey: requested. Amniocentesis discussed: not indicated. VBAC calculator score: VBAC consent form provided No orders of the defined types were placed in this encounter.  No orders of the defined types were placed in this encounter.   Follow up in 4 weeks. 50% of 20 min visit spent on counseling and coordination of care. Patient ID: [name], female   DOB: May 30, 1991, 25 y.o.   MRN: [id number]

## 2016-05-26 ENCOUNTER — Ambulatory Visit (INDEPENDENT_AMBULATORY_CARE_PROVIDER_SITE_OTHER): Payer: 59

## 2016-05-26 ENCOUNTER — Other Ambulatory Visit: Payer: Self-pay | Admitting: Obstetrics

## 2016-05-26 DIAGNOSIS — O3680X1 Pregnancy with inconclusive fetal viability, fetus 1: Secondary | ICD-10-CM

## 2016-05-26 DIAGNOSIS — Z3687 Encounter for antenatal screening for uncertain dates: Secondary | ICD-10-CM

## 2016-05-27 LAB — PAP IG W/ RFLX HPV ASCU: PAP SMEAR COMMENT: 0

## 2016-05-31 LAB — NUSWAB VG+, CANDIDA 6SP
CANDIDA ALBICANS, NAA: NEGATIVE
CANDIDA PARAPSILOSIS, NAA: POSITIVE — AB
CANDIDA TROPICALIS, NAA: NEGATIVE
Candida glabrata, NAA: NEGATIVE
Candida krusei, NAA: NEGATIVE
Candida lusitaniae, NAA: NEGATIVE
Chlamydia trachomatis, NAA: NEGATIVE
Neisseria gonorrhoeae, NAA: NEGATIVE
TRICH VAG BY NAA: NEGATIVE

## 2016-06-03 ENCOUNTER — Other Ambulatory Visit: Payer: Self-pay | Admitting: Obstetrics

## 2016-06-03 DIAGNOSIS — B3731 Acute candidiasis of vulva and vagina: Secondary | ICD-10-CM

## 2016-06-03 DIAGNOSIS — B373 Candidiasis of vulva and vagina: Secondary | ICD-10-CM

## 2016-06-03 MED ORDER — FLUCONAZOLE 150 MG PO TABS: 150.0000 mg | ORAL_TABLET | Freq: Once | ORAL | 2 refills | Status: AC

## 2016-06-20 ENCOUNTER — Encounter: Payer: Self-pay | Admitting: *Deleted

## 2016-06-20 ENCOUNTER — Ambulatory Visit (INDEPENDENT_AMBULATORY_CARE_PROVIDER_SITE_OTHER): Payer: 59 | Admitting: Family Medicine

## 2016-06-20 VITALS — BP 99/61 | HR 74 | Temp 98.4°F | Wt 115.3 lb

## 2016-06-20 DIAGNOSIS — Z23 Encounter for immunization: Secondary | ICD-10-CM | POA: Diagnosis not present

## 2016-06-20 DIAGNOSIS — Z3480 Encounter for supervision of other normal pregnancy, unspecified trimester: Secondary | ICD-10-CM

## 2016-06-20 DIAGNOSIS — Z3482 Encounter for supervision of other normal pregnancy, second trimester: Secondary | ICD-10-CM

## 2016-06-20 NOTE — Patient Instructions (Signed)

## 2016-06-20 NOTE — Progress Notes (Signed)
   PRENATAL VISIT NOTE  Subjective:  [name] is a 25 y.o. G2P1001 at 3558w2d being seen today for ongoing prenatal care.  She is currently monitored for the following issues for this low-risk pregnancy and has Supervision of normal pregnancy, antepartum on her problem list.  Patient reports no complaints.  Contractions: Not present. Vag. Bleeding: None.  Movement: Absent. Denies leaking of fluid.   The following portions of the patient's history were reviewed and updated as appropriate: allergies, current medications, past family history, past medical history, past social history, past surgical history and problem list. Problem list updated.  Objective:   Vitals:   06/20/16 0848  BP: 99/61  Pulse: 74  Temp: 98.4 F (36.9 C)  Weight: 115 lb 4.8 oz (52.3 kg)    Fetal Status: Fetal Heart Rate (bpm): 152 Fundal Height: 16 cm Movement: Absent     General:  Alert, oriented and cooperative. Patient is in no acute distress.  Skin: Skin is warm and dry. No rash noted.   Cardiovascular: Normal heart rate noted  Respiratory: Normal respiratory effort, no problems with respiration noted  Abdomen: Soft, gravid, appropriate for gestational age. Pain/Pressure: Present     Pelvic:  Cervical exam deferred        Extremities: Normal range of motion.  Edema: None  Mental Status: Normal mood and affect. Normal behavior. Normal judgment and thought content.   Assessment and Plan:  Pregnancy: G2P1001 at 8258w2d  1. Supervision of other normal pregnancy, antepartum Continue routine prenatal care. Reports u/s changed her dating, but result is unavailable at time of this visit. - AFP, Quad Screen - HIV antibody - Prenatal Profile I - ToxASSURE Select 13 (MW), Urine - US OB Comp + 14 Wk; Future - Flu Vaccine QUAD 36+ mos IM  General obstetric precautions including but not limited to vaginal bleeding, contractions, leaking of fluid and fetal movement were reviewed in detail with the patient. Please  refer to After Visit Summary for other counseling recommendations.  Return in 4 weeks (on 07/18/2016).  Reva Boresanya S Quinci Gavidia, MD

## 2016-06-20 NOTE — Progress Notes (Signed)
Patient c/o lower abdominal pain.

## 2016-06-26 LAB — AFP, QUAD SCREEN
DIA MOM VALUE: 0.75
DIA Value (EIA): 158.54 pg/mL
DSR (BY AGE) 1 IN: 1081
DSR (Second Trimester) 1 IN: 10000
Gestational Age: 16.3 WEEKS
MATERNAL AGE AT EDD: 23.7 a
MSAFP MOM: 1.44
MSAFP: 58.9 ng/mL
MSHCG MOM: 0.61
MSHCG: 27267 m[IU]/mL
Osb Risk: 3220
T18 (By Age): 1:4212 {titer}
TEST RESULTS AFP: NEGATIVE
UE3 VALUE: 0.97 ng/mL
Weight: 115 [lb_av]
uE3 Mom: 1

## 2016-06-26 LAB — PRENATAL PROFILE I(LABCORP)
Antibody Screen: NEGATIVE
BASOS ABS: 0 10*3/uL (ref 0.0–0.2)
Basos: 0 %
EOS (ABSOLUTE): 0.5 10*3/uL — AB (ref 0.0–0.4)
EOS: 7 %
HEMATOCRIT: 32 % — AB (ref 34.0–46.6)
Hemoglobin: 10.8 g/dL — ABNORMAL LOW (ref 11.1–15.9)
Hepatitis B Surface Ag: NEGATIVE
IMMATURE GRANS (ABS): 0 10*3/uL (ref 0.0–0.1)
IMMATURE GRANULOCYTES: 0 %
LYMPHS: 30 %
Lymphocytes Absolute: 2.4 10*3/uL (ref 0.7–3.1)
MCH: 22.5 pg — ABNORMAL LOW (ref 26.6–33.0)
MCHC: 33.8 g/dL (ref 31.5–35.7)
MCV: 67 fL — AB (ref 79–97)
MONOCYTES: 8 %
Monocytes Absolute: 0.6 10*3/uL (ref 0.1–0.9)
Neutrophils Absolute: 4.3 10*3/uL (ref 1.4–7.0)
Neutrophils: 55 %
PLATELETS: 232 10*3/uL (ref 150–379)
RBC: 4.79 x10E6/uL (ref 3.77–5.28)
RDW: 15.4 % (ref 12.3–15.4)
RH TYPE: POSITIVE
RPR Ser Ql: NONREACTIVE
Rubella Antibodies, IGG: 5.17 index (ref >0.99)
WBC: 7.9 10*3/uL (ref 3.4–10.8)

## 2016-06-26 LAB — HIV ANTIBODY (ROUTINE TESTING W REFLEX): HIV SCREEN 4TH GENERATION: NONREACTIVE

## 2016-06-26 LAB — TOXASSURE SELECT 13 (MW), URINE

## 2016-07-05 ENCOUNTER — Encounter: Payer: Self-pay | Admitting: *Deleted

## 2016-07-05 ENCOUNTER — Other Ambulatory Visit: Payer: Self-pay | Admitting: Obstetrics

## 2016-07-05 ENCOUNTER — Ambulatory Visit (INDEPENDENT_AMBULATORY_CARE_PROVIDER_SITE_OTHER): Payer: Medicaid Other

## 2016-07-05 DIAGNOSIS — B373 Candidiasis of vulva and vagina: Secondary | ICD-10-CM

## 2016-07-05 DIAGNOSIS — Z3482 Encounter for supervision of other normal pregnancy, second trimester: Secondary | ICD-10-CM | POA: Diagnosis not present

## 2016-07-05 DIAGNOSIS — B3731 Acute candidiasis of vulva and vagina: Secondary | ICD-10-CM

## 2016-07-05 DIAGNOSIS — Z3480 Encounter for supervision of other normal pregnancy, unspecified trimester: Secondary | ICD-10-CM

## 2016-07-05 MED ORDER — TERCONAZOLE 0.8 % VA CREA: 1.0000 | TOPICAL_CREAM | Freq: Every day | VAGINAL | 2 refills | Status: DC

## 2016-07-08 ENCOUNTER — Other Ambulatory Visit: Payer: Medicaid Other

## 2016-07-18 ENCOUNTER — Encounter: Payer: Self-pay | Admitting: Obstetrics

## 2016-07-18 ENCOUNTER — Ambulatory Visit (INDEPENDENT_AMBULATORY_CARE_PROVIDER_SITE_OTHER): Payer: 59 | Admitting: Obstetrics

## 2016-07-18 VITALS — BP 91/65 | HR 72 | Temp 98.0°F | Wt 112.7 lb

## 2016-07-18 DIAGNOSIS — Z3490 Encounter for supervision of normal pregnancy, unspecified, unspecified trimester: Secondary | ICD-10-CM

## 2016-07-18 DIAGNOSIS — Z3480 Encounter for supervision of other normal pregnancy, unspecified trimester: Secondary | ICD-10-CM

## 2016-07-18 DIAGNOSIS — Z3482 Encounter for supervision of other normal pregnancy, second trimester: Secondary | ICD-10-CM

## 2016-07-18 NOTE — Progress Notes (Signed)
Subjective:    [name] is a 25 y.o. female being seen today for her obstetrical visit. She is at 1164w2d gestation. Patient reports: no complaints . Fetal movement: normal.  Problem List Items Addressed This Visit    Supervision of normal pregnancy, antepartum    Other Visit Diagnoses    Encounter for supervision of low-risk pregnancy, antepartum    -  Primary     Patient Active Problem List   Diagnosis Date Noted  . Supervision of normal pregnancy, antepartum 05/22/2016   Objective:    BP 91/65   Pulse 72   Temp 98 F (36.7 C)   Wt 112 lb 11.2 oz (51.1 kg)   LMP  (LMP Unknown) Comment: Nexplanon removed 02/18/16.  BMI 20.61 kg/m  FHT: 150 BPM  Uterine Size: size equals dates     Assessment:    Pregnancy @ 5564w2d    Plan:    Signs and symptoms of preterm labor: discussed.  Labs, problem list reviewed and updated 2 hr GTT planned Follow up in 4 weeks.

## 2016-08-15 ENCOUNTER — Encounter: Payer: Self-pay | Admitting: Obstetrics

## 2016-08-15 ENCOUNTER — Ambulatory Visit (INDEPENDENT_AMBULATORY_CARE_PROVIDER_SITE_OTHER): Payer: Medicaid Other | Admitting: Obstetrics

## 2016-08-15 VITALS — BP 100/62 | HR 77 | Temp 98.1°F | Wt 116.6 lb

## 2016-08-15 DIAGNOSIS — Z3492 Encounter for supervision of normal pregnancy, unspecified, second trimester: Secondary | ICD-10-CM

## 2016-08-15 DIAGNOSIS — Z3490 Encounter for supervision of normal pregnancy, unspecified, unspecified trimester: Secondary | ICD-10-CM

## 2016-08-15 NOTE — Patient Instructions (Addendum)

## 2016-08-15 NOTE — Progress Notes (Signed)
Subjective:    [name] is a 25 y.o. female being seen today for her obstetrical visit. She is at 6379w2d gestation. Patient reports: no complaints . Fetal movement: normal.  Problem List Items Addressed This Visit    None    Visit Diagnoses    Encounter for supervision of low-risk pregnancy, antepartum    -  Primary     Patient Active Problem List   Diagnosis Date Noted  . Supervision of normal pregnancy, antepartum 05/22/2016   Objective:    BP 100/62   Pulse 77   Temp 98.1 F (36.7 C)   Wt 116 lb 9.6 oz (52.9 kg)   LMP  (LMP Unknown) Comment: Nexplanon removed 02/18/16.  BMI 21.33 kg/m  FHT: 150 BPM  Uterine Size: size equals dates     Assessment:    Pregnancy @ 4679w2d    Plan:    OBGCT: ordered for next visit. Signs and symptoms of preterm labor: discussed.  Labs, problem list reviewed and updated 2 hr GTT planned Follow up in 4 weeks.

## 2016-08-19 ENCOUNTER — Ambulatory Visit (HOSPITAL_COMMUNITY)
Admission: EM | Admit: 2016-08-19 | Discharge: 2016-08-19 | Disposition: A | Discharge status: Home / Self Care | Payer: Medicaid Other | Attending: Internal Medicine | Admitting: Internal Medicine

## 2016-08-19 ENCOUNTER — Encounter (HOSPITAL_COMMUNITY): Payer: Self-pay | Admitting: Emergency Medicine

## 2016-08-19 DIAGNOSIS — H1011 Acute atopic conjunctivitis, right eye: Secondary | ICD-10-CM

## 2016-08-19 DIAGNOSIS — J00 Acute nasopharyngitis [common cold]: Secondary | ICD-10-CM | POA: Diagnosis not present

## 2016-08-19 MED ORDER — IPRATROPIUM BROMIDE 0.06 % NA SOLN: 2.0000 | Freq: Four times a day (QID) | NASAL | 0 refills | Status: DC

## 2016-08-19 MED ORDER — POLYMYXIN B-TRIMETHOPRIM 10000-0.1 UNIT/ML-% OP SOLN: 2.0000 [drp] | OPHTHALMIC | 0 refills | Status: DC

## 2016-08-19 NOTE — ED Provider Notes (Signed)
CSN: [id number]     Arrival date & time 08/19/16  1005 History   First MD Initiated Contact with Patient 08/19/16 1034     Chief Complaint  Patient presents with  . Eye Problem   (Consider location/radiation/quality/duration/timing/severity/associated sxs/prior Treatment) Patient c/o right red eye and itchiness.    Conjunctivitis  This is a new problem. The current episode started yesterday. The problem occurs constantly. The problem has not changed since onset.Nothing aggravates the symptoms. Nothing relieves the symptoms. She has tried nothing for the symptoms.    Past Medical History:  Diagnosis Date  . Medical history non-contributory    Past Surgical History:  Procedure Laterality Date  . NO PAST SURGERIES     Family History  Problem Relation Age of Onset  . Hearing loss Neg Hx    Social History  Substance Use Topics  . Smoking status: Never Smoker  . Smokeless tobacco: Never Used  . Alcohol use No   OB History    Gravida Para Term Preterm AB Living   2 1 1  0 0 1   SAB TAB Ectopic Multiple Live Births   0 0 0 0 1     Review of Systems  Constitutional: Negative.   HENT: Negative.   Eyes: Positive for redness and itching.  Respiratory: Negative.   Cardiovascular: Negative.   Gastrointestinal: Negative.   Endocrine: Negative.   Genitourinary: Negative.   Musculoskeletal: Negative.   Skin: Negative.   Allergic/Immunologic: Negative.   Neurological: Negative.   Hematological: Negative.   Psychiatric/Behavioral: Negative.     Allergies  Patient has no known allergies.  Home Medications   Prior to Admission medications   Medication Sig Start Date End Date Taking? Authorizing Provider  Prenatal Vit-Fe Fumarate-FA (PNV PRENATAL PLUS MULTIVITAMIN) 27-1 MG TABS Take 1 tablet by mouth daily before breakfast. 02/18/16  Yes Brock Badharles A Harper, MD  ibuprofen (ADVIL,MOTRIN) 600 MG tablet Take 1 tablet (600 mg total) by mouth every 6 (six) hours as needed for pain  (pain scale < 4). Patient not taking: Reported on 05/23/2016 12/12/12   Brock Badharles A Harper, MD  ipratropium (ATROVENT) 0.06 % nasal spray Place 2 sprays into both nostrils 4 (four) times daily. 08/19/16   Deatra CanterWilliam J Oxford, FNP  terconazole (TERAZOL 3) 0.8 % vaginal cream Place 1 applicator vaginally at bedtime. 07/05/16   Brock Badharles A Harper, MD  trimethoprim-polymyxin b (POLYTRIM) ophthalmic solution Place 2 drops into the right eye every 4 (four) hours. 08/19/16   Deatra CanterWilliam J Oxford, FNP   Meds Ordered and Administered this Visit  Medications - No data to display  BP 106/68 (BP Location: Left Arm)   Pulse 82   Temp 98 F (36.7 C) (Oral)   Resp 16   LMP  (LMP Unknown) Comment: Nexplanon removed 02/18/16.  SpO2 100%  No data found.   Physical Exam  Constitutional: She appears well-developed and well-nourished.  HENT:  Head: Normocephalic and atraumatic.  Right Ear: External ear normal.  Left Ear: External ear normal.  Mouth/Throat: Oropharynx is clear and moist.  Eyes: EOM are normal. Pupils are equal, round, and reactive to light.  Right conjunctiva injected  Neck: Normal range of motion. Neck supple.  Cardiovascular: Normal rate, regular rhythm and normal heart sounds.   Pulmonary/Chest: Effort normal and breath sounds normal.  Abdominal: Soft. Bowel sounds are normal.  Nursing note and vitals reviewed.   Urgent Care Course   Clinical Course     Procedures (including critical care time)  Labs  Review Labs Reviewed - No data to display  Imaging Review No results found.   Visual Acuity Review  Right Eye Distance:   Left Eye Distance:   Bilateral Distance:    Right Eye Near:   Left Eye Near:    Bilateral Near:         MDM   1. Acute atopic conjunctivitis of right eye   2. Other acute rhinitis    Atrovent nasal spray 0.06% 2 sprays per nostril qid prn  #4215ml Polytrim 2 gtt's OD q4 hours #6010ml      Deatra CanterWilliam J Oxford, FNP 08/19/16 1420

## 2016-08-19 NOTE — ED Triage Notes (Signed)
The patient presented to the [facility] with a complaint of a red and itchy swollen right eye that started last night.

## 2016-09-05 NOTE — L&D Delivery Note (Signed)
Vaginal Delivery Note  26 y.o. G2P1001 at 5861w5d delivered a viable female infant at 0314 in cephalic, OA position. No nuchal cord. Right anterior shoulder delivered with ease. Sixty sec delayed cord clamping. Cord clamped x2 and cut. Placenta delivered spontaneously intact, with 3VC. Fundus firm on exam with massage and pitocin.  Mother: Anesthesia: None Laceration: None Suture repair: N/A EBL: 100 mL  Baby: Apgars: 9, 9 Weight: Pending Cord pH: Not sent  Good hemostasis noted. Mom to postpartum.  Baby to Couplet care / Skin to Skin.  Wendee Beaversavid J McMullen, DO, PGY-1 12/01/2016, 3:22 AM   Patient is a G2P1001 at 1361w5d who was admitted with SOL, uncomplicated prenatal course.  She progressed without augmentation.  I was gloved and present for delivery in its entirety.  Second stage of labor progressed, baby delivered after a couple contractions.  Mild decels during second stage noted.  Complications: none  Lacerations: none  EBL: 100cc  Cam HaiSHAW, KIMBERLY, CNM 7:27 AM 12/01/2016

## 2016-09-12 ENCOUNTER — Ambulatory Visit (INDEPENDENT_AMBULATORY_CARE_PROVIDER_SITE_OTHER): Payer: Medicaid Other | Admitting: Obstetrics

## 2016-09-12 ENCOUNTER — Encounter: Payer: Self-pay | Admitting: Obstetrics

## 2016-09-12 ENCOUNTER — Other Ambulatory Visit: Payer: Medicaid Other

## 2016-09-12 VITALS — BP 110/69 | HR 82 | Wt 124.6 lb

## 2016-09-12 DIAGNOSIS — Z3400 Encounter for supervision of normal first pregnancy, unspecified trimester: Secondary | ICD-10-CM

## 2016-09-12 DIAGNOSIS — Z3403 Encounter for supervision of normal first pregnancy, third trimester: Secondary | ICD-10-CM

## 2016-09-12 DIAGNOSIS — Z3480 Encounter for supervision of other normal pregnancy, unspecified trimester: Secondary | ICD-10-CM

## 2016-09-12 NOTE — Progress Notes (Signed)
Patient thinks she still has pink eye symptoms. Patient also has cough and sore throat. Patient has not been on antibiotic.

## 2016-09-12 NOTE — Progress Notes (Signed)
Subjective:    [name] is a 26 y.o. female being seen today for her obstetrical visit. She is at 1047w2d gestation. Patient reports no complaints. Fetal movement: normal.  Problem List Items Addressed This Visit    Supervision of normal pregnancy, antepartum - Primary   Relevant Orders   Glucose Tolerance, 2 Hours w/1 Hour   CBC   HIV antibody   RPR     Patient Active Problem List   Diagnosis Date Noted  . Supervision of normal pregnancy, antepartum 05/22/2016   Objective:    BP 110/69   Pulse 82   Wt 124 lb 9.6 oz (56.5 kg)   LMP  (LMP Unknown) Comment: Nexplanon removed 02/18/16.  BMI 22.79 kg/m  FHT:  150 BPM  Uterine Size: size equals dates  Presentation: unsure     Assessment:    Pregnancy @ 5447w2d weeks   Plan:     labs reviewed, problem list updated Consent signed. GBS sent TDAP offered  Rhogam given for RH negative Pediatrician: discussed. Infant feeding: plans to breastfeed. Maternity leave: discussed. Cigarette smoking: never smoked. Orders Placed This Encounter  Procedures  . Glucose Tolerance, 2 Hours w/1 Hour  . CBC  . HIV antibody  . RPR   No orders of the defined types were placed in this encounter.  Follow up in 2 Weeks.   Patient ID: [name], female   DOB: 1991-03-21, 26 y.o.   MRN: [id number]

## 2016-09-13 LAB — CBC
Hematocrit: 35.7 % (ref 34.0–46.6)
Hemoglobin: 11.1 g/dL (ref 11.1–15.9)
MCH: 22.4 pg — AB (ref 26.6–33.0)
MCHC: 31.1 g/dL — AB (ref 31.5–35.7)
MCV: 72 fL — AB (ref 79–97)
Platelets: 278 10*3/uL (ref 150–379)
RBC: 4.95 x10E6/uL (ref 3.77–5.28)
RDW: 16.8 % — AB (ref 12.3–15.4)
WBC: 12.4 10*3/uL — ABNORMAL HIGH (ref 3.4–10.8)

## 2016-09-13 LAB — GLUCOSE TOLERANCE, 2 HOURS W/ 1HR
GLUCOSE, 1 HOUR: 98 mg/dL (ref 65–179)
GLUCOSE, FASTING: 63 mg/dL — AB (ref 65–91)
Glucose, 2 hour: 107 mg/dL (ref 65–152)

## 2016-09-13 LAB — RPR: RPR Ser Ql: NONREACTIVE

## 2016-09-13 LAB — HIV ANTIBODY (ROUTINE TESTING W REFLEX): HIV SCREEN 4TH GENERATION: NONREACTIVE

## 2016-09-26 ENCOUNTER — Ambulatory Visit (INDEPENDENT_AMBULATORY_CARE_PROVIDER_SITE_OTHER): Payer: Medicaid Other | Admitting: Obstetrics

## 2016-09-26 ENCOUNTER — Encounter: Payer: Self-pay | Admitting: Obstetrics

## 2016-09-26 VITALS — BP 111/79 | HR 94 | Wt 126.0 lb

## 2016-09-26 DIAGNOSIS — Z3490 Encounter for supervision of normal pregnancy, unspecified, unspecified trimester: Secondary | ICD-10-CM

## 2016-09-26 DIAGNOSIS — Z3483 Encounter for supervision of other normal pregnancy, third trimester: Secondary | ICD-10-CM

## 2016-09-26 DIAGNOSIS — Z23 Encounter for immunization: Secondary | ICD-10-CM

## 2016-09-26 DIAGNOSIS — Z3480 Encounter for supervision of other normal pregnancy, unspecified trimester: Secondary | ICD-10-CM

## 2016-09-26 NOTE — Progress Notes (Signed)
Subjective:    [name] is a 26 y.o. female being seen today for her obstetrical visit. She is at 2862w2d gestation. Patient reports no complaints. Fetal movement: normal.  Problem List Items Addressed This Visit    Supervision of normal pregnancy, antepartum     Patient Active Problem List   Diagnosis Date Noted  . Supervision of normal pregnancy, antepartum 05/22/2016   Objective:    BP 111/79   Pulse 94   Wt 126 lb (57.2 kg)   LMP  (LMP Unknown) Comment: Nexplanon removed 02/18/16.  BMI 23.05 kg/m  FHT:  50 BPM  Uterine Size: size equals dates  Presentation: unsure     Assessment:    Pregnancy @ 862w2d weeks   Plan:     labs reviewed, problem list updated Consent signed. GBS sent TDAP offered  Rhogam given for RH negative Pediatrician: discussed. Infant feeding: plans to breastfeed. Maternity leave: discussed. Cigarette smoking: never smoked. Orders Placed This Encounter  Procedures  . Tdap vaccine greater than or equal to 7yo IM   No orders of the defined types were placed in this encounter.  Follow up in 2 Weeks.   Patient ID: [name], female   DOB: 12-Aug-1991, 26 y.o.   MRN: [id number]

## 2016-10-10 ENCOUNTER — Ambulatory Visit (INDEPENDENT_AMBULATORY_CARE_PROVIDER_SITE_OTHER): Payer: Medicaid Other | Admitting: Obstetrics

## 2016-10-10 ENCOUNTER — Encounter: Payer: Self-pay | Admitting: Obstetrics

## 2016-10-10 VITALS — BP 103/66 | HR 92 | Wt 126.0 lb

## 2016-10-10 DIAGNOSIS — Z3490 Encounter for supervision of normal pregnancy, unspecified, unspecified trimester: Secondary | ICD-10-CM

## 2016-10-10 DIAGNOSIS — Z3483 Encounter for supervision of other normal pregnancy, third trimester: Secondary | ICD-10-CM

## 2016-10-10 DIAGNOSIS — Z3480 Encounter for supervision of other normal pregnancy, unspecified trimester: Secondary | ICD-10-CM

## 2016-10-10 NOTE — Progress Notes (Signed)
Subjective:  Melizza Sinn is a 26 y.o. G2P1001 at 5767w2d being seen today for ongoing prenatal care.  She is currently monitored for the following issues for this low-risk pregnancy and has Supervision of normal pregnancy, antepartum on her problem list.  Patient reports no complaints.  Contractions: Not present. Vag. Bleeding: None.  Movement: Absent. Denies leaking of fluid.   The following portions of the patient's history were reviewed and updated as appropriate: allergies, current medications, past family history, past medical history, past social history, past surgical history and problem list. Problem list updated.  Objective:   Vitals:   10/10/16 0856  BP: 103/66  Pulse: 92  Weight: 126 lb (57.2 kg)    Fetal Status: Fetal Heart Rate (bpm): 150   Movement: Absent     General:  Alert, oriented and cooperative. Patient is in no acute distress.  Skin: Skin is warm and dry. No rash noted.   Cardiovascular: Normal heart rate noted  Respiratory: Normal respiratory effort, no problems with respiration noted  Abdomen: Soft, gravid, appropriate for gestational age. Pain/Pressure: Absent     Pelvic:  Cervical exam deferred        Extremities: Normal range of motion.  Edema: None  Mental Status: Normal mood and affect. Normal behavior. Normal judgment and thought content.   Urinalysis:      Assessment and Plan:  Pregnancy: G2P1001 at 3967w2d  There are no diagnoses linked to this encounter. Preterm labor symptoms and general obstetric precautions including but not limited to vaginal bleeding, contractions, leaking of fluid and fetal movement were reviewed in detail with the patient. Please refer to After Visit Summary for other counseling recommendations.  No Follow-up on file.   Brock Badharles A Harper, MDPatient ID: Fernanda DrumWom Meller, female   DOB: April 28, 1991, 26 y.o.   MRN: [id number]

## 2016-10-25 ENCOUNTER — Encounter: Payer: Self-pay | Admitting: Obstetrics

## 2016-10-25 ENCOUNTER — Ambulatory Visit (INDEPENDENT_AMBULATORY_CARE_PROVIDER_SITE_OTHER): Payer: Medicaid Other | Admitting: Obstetrics

## 2016-10-25 VITALS — BP 102/68 | HR 80 | Wt 130.6 lb

## 2016-10-25 DIAGNOSIS — O9989 Other specified diseases and conditions complicating pregnancy, childbirth and the puerperium: Secondary | ICD-10-CM

## 2016-10-25 DIAGNOSIS — Z3483 Encounter for supervision of other normal pregnancy, third trimester: Secondary | ICD-10-CM

## 2016-10-25 DIAGNOSIS — M545 Low back pain, unspecified: Secondary | ICD-10-CM

## 2016-10-25 DIAGNOSIS — Z3480 Encounter for supervision of other normal pregnancy, unspecified trimester: Secondary | ICD-10-CM

## 2016-10-25 NOTE — Progress Notes (Signed)
Patient ID: [name], female   DOB: 09/26/1990, 26 y.o.   MRN: [id number]

## 2016-10-25 NOTE — Progress Notes (Signed)
Patient has backache and difficulty sleeping. Subjective:  [name] is a 26 y.o. G2P1001 at 2955w3d being seen today for ongoing prenatal care.  She is currently monitored for the following issues for this low-risk pregnancy and has Supervision of normal pregnancy, antepartum on her problem list.  Patient reports backache and insomnia.  Contractions: Not present. Vag. Bleeding: None.  Movement: Present. Denies leaking of fluid.   The following portions of the patient's history were reviewed and updated as appropriate: allergies, current medications, past family history, past medical history, past social history, past surgical history and problem list. Problem list updated.  Objective:   Vitals:   10/25/16 0930  BP: 102/68  Pulse: 80  Weight: 130 lb 9.6 oz (59.2 kg)    Fetal Status:     Movement: Present     General:  Alert, oriented and cooperative. Patient is in no acute distress.  Skin: Skin is warm and dry. No rash noted.   Cardiovascular: Normal heart rate noted  Respiratory: Normal respiratory effort, no problems with respiration noted  Abdomen: Soft, gravid, appropriate for gestational age. Pain/Pressure: Present     Pelvic:  Cervical exam deferred        Extremities: Normal range of motion.  Edema: None  Mental Status: Normal mood and affect. Normal behavior. Normal judgment and thought content.   Urinalysis:      Assessment and Plan:  1. Pregnancy: G2P1001 at 5655w3d   2.Backache  [facility]  3.Insomnia Rx: -Tylenol PM recommended  There are no diagnoses linked to this encounter. Preterm labor symptoms and general obstetric precautions including but not limited to vaginal bleeding, contractions, leaking of fluid and fetal movement were reviewed in detail with the patient. Please refer to After Visit Summary for other counseling recommendations.  F/U 1 week   Brock Badharles A Harper, MD

## 2016-10-31 ENCOUNTER — Encounter: Payer: Self-pay | Admitting: *Deleted

## 2016-11-01 ENCOUNTER — Other Ambulatory Visit (HOSPITAL_COMMUNITY)
Admission: RE | Admit: 2016-11-01 | Discharge: 2016-11-01 | Disposition: A | Discharge status: Home / Self Care | Payer: Medicaid Other | Source: Ambulatory Visit | Attending: Certified Nurse Midwife | Admitting: Certified Nurse Midwife

## 2016-11-01 ENCOUNTER — Encounter: Payer: Self-pay | Admitting: Certified Nurse Midwife

## 2016-11-01 ENCOUNTER — Ambulatory Visit (INDEPENDENT_AMBULATORY_CARE_PROVIDER_SITE_OTHER): Payer: Medicaid Other | Admitting: Certified Nurse Midwife

## 2016-11-01 VITALS — BP 102/70 | HR 101 | Wt 132.0 lb

## 2016-11-01 DIAGNOSIS — Z3483 Encounter for supervision of other normal pregnancy, third trimester: Secondary | ICD-10-CM | POA: Diagnosis not present

## 2016-11-01 DIAGNOSIS — Z3A35 35 weeks gestation of pregnancy: Secondary | ICD-10-CM | POA: Diagnosis not present

## 2016-11-01 DIAGNOSIS — Z3480 Encounter for supervision of other normal pregnancy, unspecified trimester: Secondary | ICD-10-CM

## 2016-11-01 DIAGNOSIS — Z789 Other specified health status: Secondary | ICD-10-CM | POA: Insufficient documentation

## 2016-11-01 LAB — OB RESULTS CONSOLE GC/CHLAMYDIA: Gonorrhea: NEGATIVE

## 2016-11-01 NOTE — Progress Notes (Signed)
   PRENATAL VISIT NOTE  Subjective:  Philippa Shutt is a [age] y.o. G2P1001 at 7846w3d being seen today for ongoing prenatal care.  She is currently monitored for the following issues for this low-risk pregnancy and has Supervision of normal pregnancy, antepartum on her problem list.  Patient reports no complaints.  Contractions: Not present. Vag. Bleeding: None.  Movement: Present. Denies leaking of fluid.   The following portions of the patient's history were reviewed and updated as appropriate: allergies, current medications, past family history, past medical history, past social history, past surgical history and problem list. Problem list updated.  Objective:   Vitals:   11/01/16 1313  BP: 102/70  Pulse: (!) 101  Weight: 132 lb (59.9 kg)    Fetal Status: Fetal Heart Rate (bpm): 155 Fundal Height: 35 cm Movement: Present  Presentation: Vertex  General:  Alert, oriented and cooperative. Patient is in no acute distress.  Skin: Skin is warm and dry. No rash noted.   Cardiovascular: Normal heart rate noted  Respiratory: Normal respiratory effort, no problems with respiration noted  Abdomen: Soft, gravid, appropriate for gestational age. Pain/Pressure: Present     Pelvic:  Cervical exam performed Dilation: 1 Effacement (%): 50 Station: -3  Extremities: Normal range of motion.     Mental Status: Normal mood and affect. Normal behavior. Normal judgment and thought content.   Assessment and Plan:  Pregnancy: G2P1001 at 346w3d  1. Supervision of other normal pregnancy, antepartum     Doing well.  Here for exam with interpreter.  - Strep Gp B NAA - Cervicovaginal ancillary only  Preterm labor symptoms and general obstetric precautions including but not limited to vaginal bleeding, contractions, leaking of fluid and fetal movement were reviewed in detail with the patient. Please refer to After Visit Summary for other counseling recommendations.  Return in about 1 week (around 11/08/2016) for  ROB.   Roe Coombsachelle A Jahlen Bollman, CNM

## 2016-11-02 LAB — CERVICOVAGINAL ANCILLARY ONLY
BACTERIAL VAGINITIS: NEGATIVE
Candida vaginitis: NEGATIVE
Chlamydia: NEGATIVE
Neisseria Gonorrhea: NEGATIVE
Trichomonas: NEGATIVE

## 2016-11-03 LAB — STREP GP B NAA: Strep Gp B NAA: NEGATIVE

## 2016-11-09 ENCOUNTER — Ambulatory Visit (INDEPENDENT_AMBULATORY_CARE_PROVIDER_SITE_OTHER): Payer: Medicaid Other | Admitting: Certified Nurse Midwife

## 2016-11-09 ENCOUNTER — Encounter: Payer: Self-pay | Admitting: Certified Nurse Midwife

## 2016-11-09 VITALS — BP 115/76 | HR 111 | Wt 132.6 lb

## 2016-11-09 DIAGNOSIS — Z789 Other specified health status: Secondary | ICD-10-CM

## 2016-11-09 DIAGNOSIS — Z3480 Encounter for supervision of other normal pregnancy, unspecified trimester: Secondary | ICD-10-CM

## 2016-11-09 DIAGNOSIS — Z3483 Encounter for supervision of other normal pregnancy, third trimester: Secondary | ICD-10-CM

## 2016-11-09 NOTE — Progress Notes (Signed)
Patient is doing well

## 2016-11-09 NOTE — Progress Notes (Signed)
   PRENATAL VISIT NOTE  Subjective:  [name] is a 26 y.o. G2P1001 at 981w4d being seen today for ongoing prenatal care.  She is currently monitored for the following issues for this low-risk pregnancy and has Supervision of normal pregnancy, antepartum and Non-English speaking patient on her problem list.  Patient reports no complaints.  Contractions: Not present. Vag. Bleeding: None.  Movement: Present. Denies leaking of fluid.   The following portions of the patient's history were reviewed and updated as appropriate: allergies, current medications, past family history, past medical history, past social history, past surgical history and problem list. Problem list updated.  Objective:   Vitals:   11/09/16 1106  BP: 115/76  Pulse: (!) 111  Weight: 132 lb 9.6 oz (60.1 kg)    Fetal Status: Fetal Heart Rate (bpm): 152 Fundal Height: 35 cm Movement: Present     General:  Alert, oriented and cooperative. Patient is in no acute distress.  Skin: Skin is warm and dry. No rash noted.   Cardiovascular: Normal heart rate noted  Respiratory: Normal respiratory effort, no problems with respiration noted  Abdomen: Soft, gravid, appropriate for gestational age. Pain/Pressure: Absent     Pelvic:  Cervical exam deferred        Extremities: Normal range of motion.  Edema: None  Mental Status: Normal mood and affect. Normal behavior. Normal judgment and thought content.   Assessment and Plan:  Pregnancy: G2P1001 at 8581w4d  1. Supervision of other normal pregnancy, antepartum      Drink 8 80z water daily.  Will f/u in one week  2. Non-English speaking patient     Interpretor present at visit.   Preterm labor symptoms and general obstetric precautions including but not limited to vaginal bleeding, contractions, leaking of fluid and fetal movement were reviewed in detail with the patient. Please refer to After Visit Summary for other counseling recommendations.  Return in about 1 week (around  11/16/2016) for ROB.   Lynnell JudeJade C Montana, FNP

## 2016-11-16 ENCOUNTER — Encounter: Payer: Self-pay | Admitting: Certified Nurse Midwife

## 2016-11-16 ENCOUNTER — Ambulatory Visit (INDEPENDENT_AMBULATORY_CARE_PROVIDER_SITE_OTHER): Payer: Medicaid Other | Admitting: Certified Nurse Midwife

## 2016-11-16 VITALS — BP 106/69 | HR 91 | Temp 97.6°F | Wt 137.8 lb

## 2016-11-16 DIAGNOSIS — Z789 Other specified health status: Secondary | ICD-10-CM

## 2016-11-16 DIAGNOSIS — Z3483 Encounter for supervision of other normal pregnancy, third trimester: Secondary | ICD-10-CM

## 2016-11-16 DIAGNOSIS — Z3480 Encounter for supervision of other normal pregnancy, unspecified trimester: Secondary | ICD-10-CM

## 2016-11-16 NOTE — Progress Notes (Signed)
   PRENATAL VISIT NOTE  Subjective:  [name] is a 26 y.o. G2P1001 at 3648w4d being seen today for ongoing prenatal care.  She is currently monitored for the following issues for this low-risk pregnancy and has Supervision of normal pregnancy, antepartum and Non-English speaking patient on her problem list.  Patient reports no complaints.  Contractions: Not present. Vag. Bleeding: None.  Movement: Present. Denies leaking of fluid.   The following portions of the patient's history were reviewed and updated as appropriate: allergies, current medications, past family history, past medical history, past social history, past surgical history and problem list. Problem list updated.  Objective:   Vitals:   11/16/16 0826  BP: 106/69  Pulse: 91  Temp: 97.6 F (36.4 C)  Weight: 137 lb 12.8 oz (62.5 kg)    Fetal Status: Fetal Heart Rate (bpm): 156 Fundal Height: 37 cm Movement: Present     General:  Alert, oriented and cooperative. Patient is in no acute distress.  Skin: Skin is warm and dry. No rash noted.   Cardiovascular: Normal heart rate noted  Respiratory: Normal respiratory effort, no problems with respiration noted  Abdomen: Soft, gravid, appropriate for gestational age. Pain/Pressure: Absent     Pelvic:  Cervical exam deferred        Extremities: Normal range of motion.  Edema: None  Mental Status: Normal mood and affect. Normal behavior. Normal judgment and thought content.   Assessment and Plan:  Pregnancy: G2P1001 at 7348w4d  1. Supervision of other normal pregnancy, antepartum     Doing well  2. Non-English speaking patient    Interpreter present for exam.  Understands some english.   Preterm labor symptoms and general obstetric precautions including but not limited to vaginal bleeding, contractions, leaking of fluid and fetal movement were reviewed in detail with the patient. Please refer to After Visit Summary for other counseling recommendations.  Return in about 1 week  (around 11/23/2016) for ROB.   Roe Coombsachelle A Karen Huhta, CNM.

## 2016-11-16 NOTE — Progress Notes (Signed)
Patient reports good fetal movement, denies contractions. 

## 2016-11-23 ENCOUNTER — Ambulatory Visit (INDEPENDENT_AMBULATORY_CARE_PROVIDER_SITE_OTHER): Payer: Medicaid Other | Admitting: Certified Nurse Midwife

## 2016-11-23 ENCOUNTER — Encounter: Payer: Self-pay | Admitting: Certified Nurse Midwife

## 2016-11-23 VITALS — BP 104/73 | HR 94 | Wt 138.2 lb

## 2016-11-23 DIAGNOSIS — Z789 Other specified health status: Secondary | ICD-10-CM

## 2016-11-23 DIAGNOSIS — Z3483 Encounter for supervision of other normal pregnancy, third trimester: Secondary | ICD-10-CM

## 2016-11-23 DIAGNOSIS — Z3480 Encounter for supervision of other normal pregnancy, unspecified trimester: Secondary | ICD-10-CM

## 2016-11-23 NOTE — Progress Notes (Signed)
   PRENATAL VISIT NOTE  Subjective:  Henna Mccathern is a 26 y.o. G2P1001 at 2078w4d being seen today for ongoing prenatal care.  She is currently monitored for the following issues for this low-risk pregnancy and has Supervision of normal pregnancy, antepartum and Non-English speaking patient on her problem list.  Patient reports no complaints.  Contractions: Irregular. Vag. Bleeding: None.  Movement: Present. Denies leaking of fluid.   The following portions of the patient's history were reviewed and updated as appropriate: allergies, current medications, past family history, past medical history, past social history, past surgical history and problem list. Problem list updated.  Objective:   Vitals:   11/23/16 0836  BP: 104/73  Pulse: 94  Weight: 138 lb 3.2 oz (62.7 kg)    Fetal Status: Fetal Heart Rate (bpm): 136 Fundal Height: 38 cm Movement: Present  Presentation: Vertex  General:  Alert, oriented and cooperative. Patient is in no acute distress.  Skin: Skin is warm and dry. No rash noted.   Cardiovascular: Normal heart rate noted  Respiratory: Normal respiratory effort, no problems with respiration noted  Abdomen: Soft, gravid, appropriate for gestational age. Pain/Pressure: Absent     Pelvic:  Cervical exam performed Dilation: 2.5 Effacement (%): 60 Station: -3  Extremities: Normal range of motion.  Edema: None  Mental Status: Normal mood and affect. Normal behavior. Normal judgment and thought content.   Assessment and Plan:  Pregnancy: G2P1001 at 3278w4d  1. Supervision of other normal pregnancy, antepartum     Doing well  2. Non-English speaking patient     Here for exam with interpreter  Term labor symptoms and general obstetric precautions including but not limited to vaginal bleeding, contractions, leaking of fluid and fetal movement were reviewed in detail with the patient. Please refer to After Visit Summary for other counseling recommendations.  Return in about 1 week  (around 11/30/2016) for ROB.   Roe Coombsachelle A Jenia Klepper, CNM

## 2016-11-23 NOTE — Progress Notes (Signed)
Patient states that she has contractions sometimes, reports good fetal movement.

## 2016-11-26 ENCOUNTER — Encounter (HOSPITAL_COMMUNITY): Payer: Self-pay | Admitting: *Deleted

## 2016-11-26 ENCOUNTER — Inpatient Hospital Stay (HOSPITAL_COMMUNITY)
Admission: AD | Admit: 2016-11-26 | Discharge: 2016-11-26 | Disposition: A | Discharge status: Home / Self Care | Payer: Medicaid Other | Source: Ambulatory Visit | Attending: Obstetrics & Gynecology | Admitting: Obstetrics & Gynecology

## 2016-11-26 DIAGNOSIS — Z3490 Encounter for supervision of normal pregnancy, unspecified, unspecified trimester: Secondary | ICD-10-CM | POA: Diagnosis present

## 2016-11-26 DIAGNOSIS — Z3480 Encounter for supervision of other normal pregnancy, unspecified trimester: Secondary | ICD-10-CM

## 2016-11-26 DIAGNOSIS — Z3A00 Weeks of gestation of pregnancy not specified: Secondary | ICD-10-CM | POA: Insufficient documentation

## 2016-11-26 DIAGNOSIS — O479 False labor, unspecified: Secondary | ICD-10-CM

## 2016-11-26 MED ORDER — ZOLPIDEM TARTRATE 5 MG PO TABS
5.0000 mg | ORAL_TABLET | Freq: Once | ORAL | Status: AC
  Administered 2016-11-26: 5 mg via ORAL
  Filled 2016-11-26: qty 1

## 2016-11-26 NOTE — Discharge Instructions (Signed)

## 2016-11-26 NOTE — MAU Note (Signed)
Contractions since Friday but stronger now. Some mucousy d/c but no bleeding or LOF. 2.5cm last sve

## 2016-11-26 NOTE — OB Triage Note (Signed)
I have communicated with Philipp DeputyKim Shaw, cnm and reviewed vital signs:  Vitals:   11/26/16 0227 11/26/16 0306  BP: 121/77 123/73  Pulse: 85 (!) 113  Resp: 18 18  Temp: 97.8 F (36.6 C) 98.2 F (36.8 C)    Vaginal exam:  Dilation: 2.5 Effacement (%): 70 Station: -2 Presentation: Vertex Exam by:: Karl Itoiana Ansah-Mensah, rnc ,   Also reviewed contraction pattern and that non-stress test is reactive.  It has been documented that patient is contracting every 5-6 minutes with no cervical change from last exam no other complaints.  Based on this report provider has given order for discharge.  A discharge order and diagnosis entered by a provider.   Labor discharge instructions reviewed with patient.

## 2016-11-30 ENCOUNTER — Ambulatory Visit (INDEPENDENT_AMBULATORY_CARE_PROVIDER_SITE_OTHER): Payer: Medicaid Other | Admitting: Certified Nurse Midwife

## 2016-11-30 ENCOUNTER — Inpatient Hospital Stay (HOSPITAL_COMMUNITY)
Admission: AD | Admit: 2016-11-30 | Discharge: 2016-11-30 | Disposition: A | Discharge status: Home / Self Care | Payer: Medicaid Other | Source: Ambulatory Visit | Attending: Family Medicine | Admitting: Family Medicine

## 2016-11-30 ENCOUNTER — Encounter: Payer: Self-pay | Admitting: Certified Nurse Midwife

## 2016-11-30 ENCOUNTER — Encounter (HOSPITAL_COMMUNITY): Payer: Self-pay | Admitting: *Deleted

## 2016-11-30 DIAGNOSIS — O479 False labor, unspecified: Secondary | ICD-10-CM

## 2016-11-30 DIAGNOSIS — Z3480 Encounter for supervision of other normal pregnancy, unspecified trimester: Secondary | ICD-10-CM

## 2016-11-30 DIAGNOSIS — Z3483 Encounter for supervision of other normal pregnancy, third trimester: Secondary | ICD-10-CM

## 2016-11-30 NOTE — Progress Notes (Signed)
Patient reports having contractions last night but have since stopped, reports pressure and good fetal movement.

## 2016-11-30 NOTE — Discharge Instructions (Signed)
Third Trimester of Pregnancy °The third trimester is from week 28 through week 40 (months 7 through 9). The third trimester is a time when the unborn baby (fetus) is growing rapidly. At the end of the ninth month, the fetus is about 20 inches in length and weighs 6-10 pounds. °Body changes during your third trimester °Your body will continue to go through many changes during pregnancy. The changes vary from woman to woman. During the third trimester: °· Your weight will continue to increase. You can expect to gain 25-35 pounds (11-16 kg) by the end of the pregnancy. °· You may begin to get stretch marks on your hips, abdomen, and breasts. °· You may urinate more often because the fetus is moving lower into your pelvis and pressing on your bladder. °· You may develop or continue to have heartburn. This is caused by increased hormones that slow down muscles in the digestive tract. °· You may develop or continue to have constipation because increased hormones slow digestion and cause the muscles that push waste through your intestines to relax. °· You may develop hemorrhoids. These are swollen veins (varicose veins) in the rectum that can itch or be painful. °· You may develop swollen, bulging veins (varicose veins) in your legs. °· You may have increased body aches in the pelvis, back, or thighs. This is due to weight gain and increased hormones that are relaxing your joints. °· You may have changes in your hair. These can include thickening of your hair, rapid growth, and changes in texture. Some women also have hair loss during or after pregnancy, or hair that feels dry or thin. Your hair will most likely return to normal after your baby is born. °· Your breasts will continue to grow and they will continue to become tender. A yellow fluid (colostrum) may leak from your breasts. This is the first milk you are producing for your baby. °· Your belly button may stick out. °· You may notice more swelling in your hands,  face, or ankles. °· You may have increased tingling or numbness in your hands, arms, and legs. The skin on your belly may also feel numb. °· You may feel short of breath because of your expanding uterus. °· You may have more problems sleeping. This can be caused by the size of your belly, increased need to urinate, and an increase in your body's metabolism. °· You may notice the fetus "dropping," or moving lower in your abdomen (lightening). °· You may have increased vaginal discharge. °· You may notice your joints feel loose and you may have pain around your pelvic bone. °What to expect at prenatal visits °You will have prenatal exams every 2 weeks until week 36. Then you will have weekly prenatal exams. During a routine prenatal visit: °· You will be weighed to make sure you and the baby are growing normally. °· Your blood pressure will be taken. °· Your abdomen will be measured to track your baby's growth. °· The fetal heartbeat will be listened to. °· Any test results from the previous visit will be discussed. °· You may have a cervical check near your due date to see if your cervix has softened or thinned (effaced). °· You will be tested for Group B streptococcus. This happens between 35 and 37 weeks. °Your health care provider may ask you: °· What your birth plan is. °· How you are feeling. °· If you are feeling the baby move. °· If you have had any abnormal   symptoms, such as leaking fluid, bleeding, severe headaches, or abdominal cramping. °· If you are using any tobacco products, including cigarettes, chewing tobacco, and electronic cigarettes. °· If you have any questions. °Other tests or screenings that may be performed during your third trimester include: °· Blood tests that check for low iron levels (anemia). °· Fetal testing to check the health, activity level, and growth of the fetus. Testing is done if you have certain medical conditions or if there are problems during the pregnancy. °· Nonstress test  (NST). This test checks the health of your baby to make sure there are no signs of problems, such as the baby not getting enough oxygen. During this test, a belt is placed around your belly. The baby is made to move, and its heart rate is monitored during movement. °What is false labor? °False labor is a condition in which you feel small, irregular tightenings of the muscles in the womb (contractions) that usually go away with rest, changing position, or drinking water. These are called Braxton Hicks contractions. Contractions may last for hours, days, or even weeks before true labor sets in. If contractions come at regular intervals, become more frequent, increase in intensity, or become painful, you should see your health care provider. °What are the signs of labor? °· Abdominal cramps. °· Regular contractions that start at 10 minutes apart and become stronger and more frequent with time. °· Contractions that start on the top of the uterus and spread down to the lower abdomen and back. °· Increased pelvic pressure and dull back pain. °· A watery or bloody mucus discharge that comes from the vagina. °· Leaking of amniotic fluid. This is also known as your "water breaking." It could be a slow trickle or a gush. Let your health care provider know if it has a color or strange odor. °If you have any of these signs, call your health care provider right away, even if it is before your due date. °Follow these instructions at home: °Medicines  °· Follow your health care provider's instructions regarding medicine use. Specific medicines may be either safe or unsafe to take during pregnancy. °· Take a prenatal vitamin that contains at least 600 micrograms (mcg) of folic acid. °· If you develop constipation, try taking a stool softener if your health care provider approves. °Eating and drinking  °· Eat a balanced diet that includes fresh fruits and vegetables, whole grains, good sources of protein such as meat, eggs, or tofu,  and low-fat dairy. Your health care provider will help you determine the amount of weight gain that is right for you. °· Avoid raw meat and uncooked cheese. These carry germs that can cause birth defects in the baby. °· If you have low calcium intake from food, talk to your health care provider about whether you should take a daily calcium supplement. °· Eat four or five small meals rather than three large meals a day. °· Limit foods that are high in fat and processed sugars, such as fried and sweet foods. °· To prevent constipation: °¨ Drink enough fluid to keep your urine clear or pale yellow. °¨ Eat foods that are high in fiber, such as fresh fruits and vegetables, whole grains, and beans. °Activity  °· Exercise only as directed by your health care provider. Most women can continue their usual exercise routine during pregnancy. Try to exercise for 30 minutes at least 5 days a week. Stop exercising if you experience uterine contractions. °· Avoid heavy lifting. °·   Do not exercise in extreme heat or humidity, or at high altitudes. °· Wear low-heel, comfortable shoes. °· Practice good posture. °· You may continue to have sex unless your health care provider tells you otherwise. °Relieving pain and discomfort  °· Take frequent breaks and rest with your legs elevated if you have leg cramps or low back pain. °· Take warm sitz baths to soothe any pain or discomfort caused by hemorrhoids. Use hemorrhoid cream if your health care provider approves. °· Wear a good support bra to prevent discomfort from breast tenderness. °· If you develop varicose veins: °¨ Wear support pantyhose or compression stockings as told by your healthcare provider. °¨ Elevate your feet for 15 minutes, 3-4 times a day. °Prenatal care  °· Write down your questions. Take them to your prenatal visits. °· Keep all your prenatal visits as told by your health care provider. This is important. °Safety  °· Wear your seat belt at all times when  driving. °· Make a list of emergency phone numbers, including numbers for family, friends, the hospital, and police and fire departments. °General instructions  °· Avoid cat litter boxes and soil used by cats. These carry germs that can cause birth defects in the baby. If you have a cat, ask someone to clean the litter box for you. °· Do not travel far distances unless it is absolutely necessary and only with the approval of your health care provider. °· Do not use hot tubs, steam rooms, or saunas. °· Do not drink alcohol. °· Do not use any products that contain nicotine or tobacco, such as cigarettes and e-cigarettes. If you need help quitting, ask your health care provider. °· Do not use any medicinal herbs or unprescribed drugs. These chemicals affect the formation and growth of the baby. °· Do not douche or use tampons or scented sanitary pads. °· Do not cross your legs for long periods of time. °· To prepare for the arrival of your baby: °¨ Take prenatal classes to understand, practice, and ask questions about labor and delivery. °¨ Make a trial run to the hospital. °¨ Visit the hospital and tour the maternity area. °¨ Arrange for maternity or paternity leave through employers. °¨ Arrange for family and friends to take care of pets while you are in the hospital. °¨ Purchase a rear-facing car seat and make sure you know how to install it in your car. °¨ Pack your hospital bag. °¨ Prepare the baby’s nursery. Make sure to remove all pillows and stuffed animals from the baby's crib to prevent suffocation. °· Visit your dentist if you have not gone during your pregnancy. Use a soft toothbrush to brush your teeth and be gentle when you floss. °Contact a health care provider if: °· You are unsure if you are in labor or if your water has broken. °· You become dizzy. °· You have mild pelvic cramps, pelvic pressure, or nagging pain in your abdominal area. °· You have lower back pain. °· You have persistent nausea,  vomiting, or diarrhea. °· You have an unusual or bad smelling vaginal discharge. °· You have pain when you urinate. °Get help right away if: °· Your water breaks before 37 weeks. °· You have regular contractions less than 5 minutes apart before 37 weeks. °· You have a fever. °· You are leaking fluid from your vagina. °· You have spotting or bleeding from your vagina. °· You have severe abdominal pain or cramping. °· You have rapid weight loss or   weight gain. °· You have shortness of breath with chest pain. °· You notice sudden or extreme swelling of your face, hands, ankles, feet, or legs. °· Your baby makes fewer than 10 movements in 2 hours. °· You have severe headaches that do not go away when you take medicine. °· You have vision changes. °Summary °· The third trimester is from week 28 through week 40, months 7 through 9. The third trimester is a time when the unborn baby (fetus) is growing rapidly. °· During the third trimester, your discomfort may increase as you and your baby continue to gain weight. You may have abdominal, leg, and back pain, sleeping problems, and an increased need to urinate. °· During the third trimester your breasts will keep growing and they will continue to become tender. A yellow fluid (colostrum) may leak from your breasts. This is the first milk you are producing for your baby. °· False labor is a condition in which you feel small, irregular tightenings of the muscles in the womb (contractions) that eventually go away. These are called Braxton Hicks contractions. Contractions may last for hours, days, or even weeks before true labor sets in. °· Signs of labor can include: abdominal cramps; regular contractions that start at 10 minutes apart and become stronger and more frequent with time; watery or bloody mucus discharge that comes from the vagina; increased pelvic pressure and dull back pain; and leaking of amniotic fluid. °This information is not intended to replace advice given  to you by your health care provider. Make sure you discuss any questions you have with your health care provider. °Document Released: 08/16/2001 Document Revised: 01/28/2016 Document Reviewed: 10/23/2012 °[employer] Interactive Patient Education © 2017 [employer] °Fetal Movement Counts °Patient Name: ________________________________________________ Patient Due Date: ____________________ °What is a fetal movement count? °A fetal movement count is the number of times that you feel your baby move during a certain amount of time. This may also be called a fetal kick count. A fetal movement count is recommended for every pregnant woman. You may be asked to start counting fetal movements as early as week 28 of your pregnancy. °Pay attention to when your baby is most active. You may notice your baby's sleep and wake cycles. You may also notice things that make your baby move more. You should do a fetal movement count: °· When your baby is normally most active. °· At the same time each day. °A good time to count movements is while you are resting, after having something to eat and drink. °How do I count fetal movements? °1. Find a quiet, comfortable area. Sit, or lie down on your side. °2. Write down the date, the start time and stop time, and the number of movements that you felt between those two times. Take this information with you to your health care visits. °3. For 2 hours, count kicks, flutters, swishes, rolls, and jabs. You should feel at least 10 movements during 2 hours. °4. You may stop counting after you have felt 10 movements. °5. If you do not feel 10 movements in 2 hours, have something to eat and drink. Then, keep resting and counting for 1 hour. If you feel at least 4 movements during that hour, you may stop counting. °Contact a health care provider if: °· You feel fewer than 4 movements in 2 hours. °· Your baby is not moving like he or she usually does. °Date: ____________ Start time: ____________ Stop  time: ____________ Movements: ____________ °Date: ____________   Start time: ____________ Stop time: ____________ Movements: ____________ °Date: ____________ Start time: ____________ Stop time: ____________ Movements: ____________ °Date: ____________ Start time: ____________ Stop time: ____________ Movements: ____________ °Date: ____________ Start time: ____________ Stop time: ____________ Movements: ____________ °Date: ____________ Start time: ____________ Stop time: ____________ Movements: ____________ °Date: ____________ Start time: ____________ Stop time: ____________ Movements: ____________ °Date: ____________ Start time: ____________ Stop time: ____________ Movements: ____________ °Date: ____________ Start time: ____________ Stop time: ____________ Movements: ____________ °This information is not intended to replace advice given to you by your health care provider. Make sure you discuss any questions you have with your health care provider. °Document Released: 09/21/2006 Document Revised: 04/20/2016 Document Reviewed: 10/01/2015 °[employer] Interactive Patient Education © 2017 [employer] °Braxton Hicks Contractions °Contractions of the uterus can occur throughout pregnancy, but they are not always a sign that you are in labor. You may have practice contractions called Braxton Hicks contractions. These false labor contractions are sometimes confused with true labor. °What are Braxton Hicks contractions? °Braxton Hicks contractions are tightening movements that occur in the muscles of the uterus before labor. Unlike true labor contractions, these contractions do not result in opening (dilation) and thinning of the cervix. Toward the end of pregnancy (32-34 weeks), Braxton Hicks contractions can happen more often and may become stronger. These contractions are sometimes difficult to tell apart from true labor because they can be very uncomfortable. You should not feel embarrassed if you go to the hospital with  false labor. °Sometimes, the only way to tell if you are in true labor is for your health care provider to look for changes in the cervix. The health care provider will do a physical exam and may monitor your contractions. If you are not in true labor, the exam should show that your cervix is not dilating and your water has not broken. °If there are no prenatal problems or other health problems associated with your pregnancy, it is completely safe for you to be sent home with false labor. You may continue to have Braxton Hicks contractions until you go into true labor. °How can I tell the difference between true labor and false labor? °· Differences °¨ False labor °¨ Contractions last 30-70 seconds.: Contractions are usually shorter and not as strong as true labor contractions. °¨ Contractions become very regular.: Contractions are usually irregular. °¨ Discomfort is usually felt in the top of the uterus, and it spreads to the lower abdomen and low back.: Contractions are often felt in the front of the lower abdomen and in the groin. °¨ Contractions do not go away with walking.: Contractions may go away when you walk around or change positions while lying down. °¨ Contractions usually become more intense and increase in frequency.: Contractions get weaker and are shorter-lasting as time goes on. °¨ The cervix dilates and gets thinner.: The cervix usually does not dilate or become thin. °Follow these instructions at home: °¨ Take over-the-counter and prescription medicines only as told by your health care provider. °¨ Keep up with your usual exercises and follow other instructions from your health care provider. °¨ Eat and drink lightly if you think you are going into labor. °¨ If Braxton Hicks contractions are making you uncomfortable: °¨ Change your position from lying down or resting to walking, or change from walking to resting. °¨ Sit and rest in a tub of warm water. °¨ Drink enough fluid to keep your urine  clear or pale yellow. Dehydration may cause these contractions. °¨ Do slow and   deep breathing several times an hour. °¨ Keep all follow-up prenatal visits as told by your health care provider. This is important. °Contact a health care provider if: °¨ You have a fever. °¨ You have continuous pain in your abdomen. °Get help right away if: °¨ Your contractions become stronger, more regular, and closer together. °¨ You have fluid leaking or gushing from your vagina. °¨ You pass blood-tinged mucus (bloody show). °¨ You have bleeding from your vagina. °¨ You have low back pain that you never had before. °¨ You feel your baby’s head pushing down and causing pelvic pressure. °¨ Your baby is not moving inside you as much as it used to. °Summary °¨ Contractions that occur before labor are called Braxton Hicks contractions, false labor, or practice contractions. °¨ Braxton Hicks contractions are usually shorter, weaker, farther apart, and less regular than true labor contractions. True labor contractions usually become progressively stronger and regular and they become more frequent. °¨ Manage discomfort from Braxton Hicks contractions by changing position, resting in a warm bath, drinking plenty of water, or practicing deep breathing. °This information is not intended to replace advice given to you by your health care provider. Make sure you discuss any questions you have with your health care provider. °Document Released: 08/22/2005 Document Revised: 07/11/2016 Document Reviewed: 07/11/2016 °[employer]vier Interactive Patient Education © 2017 [employer] ° °

## 2016-11-30 NOTE — Progress Notes (Signed)
   PRENATAL VISIT NOTE  Subjective:  [name] is a 26 y.o. G2P1001 at 3363w4d being seen today for ongoing prenatal care.  She is currently monitored for the following issues for this low-risk pregnancy and has Supervision of normal pregnancy, antepartum and Non-English speaking patient on her problem list.  Patient reports" contractions since lastnight and went away this morning".  Contractions: present.  Vag. Bleeding: None.  Movement: Present. Denies leaking of fluid.   The following portions of the patient's history were reviewed and updated as appropriate: allergies, current medications, past family history, past medical history, past social history, past surgical history and problem list. Problem list updated.  Objective:   Vitals:   11/30/16 0851  BP: 111/77  Pulse: (!) 101  Temp: 97.1 F (36.2 C)  Weight: 137 lb 3.2 oz (62.2 kg)    Fetal Status: Fetal Heart Rate (bpm): 140 Fundal Height: 39 cm Movement: Present  Presentation: Vertex  General:  Alert, oriented and cooperative. Patient is in no acute distress.  Skin: Skin is warm and dry. No rash noted.   Cardiovascular: Normal heart rate noted  Respiratory: Normal respiratory effort, no problems with respiration noted  Abdomen: Soft, gravid, appropriate for gestational age.   Pelvic:  Cervical exam performed Dilation: 3 Effacement (%): 70 Station: -2Soft Midline Membranes Intact  Extremities: Normal range of motion.  Edema: None  Mental Status: Normal mood and affect. Normal behavior. Normal judgment and thought content.   Assessment and Plan:  Pregnancy: G2P1001 at 1463w4d  1. Supervision of other normal pregnancy, antepartum  Term labor symptoms and general obstetric precautions including but not limited to vaginal bleeding, contractions, leaking of fluid and fetal movement were reviewed in detail with the patient. Please refer to After Visit Summary for other counseling recommendations.  1wk follow up with NST,schedule  IOL  Roe Coombsachelle A Denney, CNM

## 2016-11-30 NOTE — MAU Note (Signed)
PT  SAYS SHE HAD VE LAST  WED   2  CM.      [facility]  HURT BAD  SINCE 9PM.

## 2016-12-01 ENCOUNTER — Inpatient Hospital Stay (HOSPITAL_COMMUNITY)
Admission: AD | Admit: 2016-12-01 | Discharge: 2016-12-02 | DRG: 775 | Disposition: A | Discharge status: Home / Self Care | Payer: Medicaid Other | Source: Ambulatory Visit | Attending: Obstetrics & Gynecology | Admitting: Obstetrics & Gynecology

## 2016-12-01 ENCOUNTER — Encounter (HOSPITAL_COMMUNITY): Payer: Self-pay

## 2016-12-01 DIAGNOSIS — Z3493 Encounter for supervision of normal pregnancy, unspecified, third trimester: Secondary | ICD-10-CM | POA: Diagnosis present

## 2016-12-01 DIAGNOSIS — Z3480 Encounter for supervision of other normal pregnancy, unspecified trimester: Secondary | ICD-10-CM

## 2016-12-01 DIAGNOSIS — Z3A39 39 weeks gestation of pregnancy: Secondary | ICD-10-CM | POA: Diagnosis not present

## 2016-12-01 LAB — CBC
HCT: 32.6 % — ABNORMAL LOW (ref 36.0–46.0)
HEMOGLOBIN: 11.3 g/dL — AB (ref 12.0–15.0)
MCH: 23 pg — ABNORMAL LOW (ref 26.0–34.0)
MCHC: 34.7 g/dL (ref 30.0–36.0)
MCV: 66.4 fL — ABNORMAL LOW (ref 78.0–100.0)
PLATELETS: 233 10*3/uL (ref 150–400)
RBC: 4.91 MIL/uL (ref 3.87–5.11)
RDW: 15.8 % — ABNORMAL HIGH (ref 11.5–15.5)
WBC: 19.6 10*3/uL — AB (ref 4.0–10.5)

## 2016-12-01 LAB — TYPE AND SCREEN
ABO/RH(D): O POS
ANTIBODY SCREEN: NEGATIVE

## 2016-12-01 MED ORDER — METHYLERGONOVINE MALEATE 0.2 MG PO TABS
0.2000 mg | ORAL_TABLET | Freq: Four times a day (QID) | ORAL | Status: DC
  Administered 2016-12-01 – 2016-12-02 (×6): 0.2 mg via ORAL
  Filled 2016-12-01 (×6): qty 1

## 2016-12-01 MED ORDER — ACETAMINOPHEN 325 MG PO TABS: 650.0000 mg | ORAL_TABLET | ORAL | Status: DC | PRN

## 2016-12-01 MED ORDER — SENNOSIDES-DOCUSATE SODIUM 8.6-50 MG PO TABS
2.0000 | ORAL_TABLET | ORAL | Status: DC
  Administered 2016-12-01: 2 via ORAL
  Filled 2016-12-01: qty 2

## 2016-12-01 MED ORDER — ONDANSETRON HCL 4 MG PO TABS: 4.0000 mg | ORAL_TABLET | ORAL | Status: DC | PRN

## 2016-12-01 MED ORDER — LACTATED RINGERS IV SOLN
INTRAVENOUS | Status: DC
  Administered 2016-12-01: 03:00:00 via INTRAVENOUS

## 2016-12-01 MED ORDER — ONDANSETRON HCL 4 MG/2ML IJ SOLN: 4.0000 mg | Freq: Four times a day (QID) | INTRAMUSCULAR | Status: DC | PRN

## 2016-12-01 MED ORDER — LACTATED RINGERS IV SOLN: 500.0000 mL | INTRAVENOUS | Status: DC | PRN

## 2016-12-01 MED ORDER — DIPHENHYDRAMINE HCL 25 MG PO CAPS: 25.0000 mg | ORAL_CAPSULE | Freq: Four times a day (QID) | ORAL | Status: DC | PRN

## 2016-12-01 MED ORDER — ONDANSETRON HCL 4 MG/2ML IJ SOLN: 4.0000 mg | INTRAMUSCULAR | Status: DC | PRN

## 2016-12-01 MED ORDER — OXYTOCIN BOLUS FROM INFUSION
500.0000 mL | Freq: Once | INTRAVENOUS | Status: AC
  Administered 2016-12-01: 500 mL via INTRAVENOUS

## 2016-12-01 MED ORDER — COCONUT OIL OIL: 1.0000 "application " | TOPICAL_OIL | Status: DC | PRN

## 2016-12-01 MED ORDER — TETANUS-DIPHTH-ACELL PERTUSSIS 5-2.5-18.5 LF-MCG/0.5 IM SUSP: 0.5000 mL | Freq: Once | INTRAMUSCULAR | Status: DC

## 2016-12-01 MED ORDER — FENTANYL CITRATE (PF) 100 MCG/2ML IJ SOLN: 100.0000 ug | INTRAMUSCULAR | Status: DC | PRN

## 2016-12-01 MED ORDER — ZOLPIDEM TARTRATE 5 MG PO TABS: 5.0000 mg | ORAL_TABLET | Freq: Every evening | ORAL | Status: DC | PRN

## 2016-12-01 MED ORDER — WITCH HAZEL-GLYCERIN EX PADS
1.0000 | MEDICATED_PAD | CUTANEOUS | Status: DC | PRN
Start: 2016-12-01 — End: 2016-12-02

## 2016-12-01 MED ORDER — PRENATAL MULTIVITAMIN CH
1.0000 | ORAL_TABLET | Freq: Every day | ORAL | Status: DC
  Administered 2016-12-01 – 2016-12-02 (×2): 1 via ORAL
  Filled 2016-12-01 (×2): qty 1

## 2016-12-01 MED ORDER — DIBUCAINE 1 % RE OINT
1.0000 | TOPICAL_OINTMENT | RECTAL | Status: DC | PRN
Start: 2016-12-01 — End: 2016-12-02

## 2016-12-01 MED ORDER — OXYCODONE-ACETAMINOPHEN 5-325 MG PO TABS: 1.0000 | ORAL_TABLET | ORAL | Status: DC | PRN

## 2016-12-01 MED ORDER — LIDOCAINE HCL (PF) 1 % IJ SOLN
30.0000 mL | INTRAMUSCULAR | Status: DC | PRN
  Filled 2016-12-01: qty 30

## 2016-12-01 MED ORDER — IBUPROFEN 600 MG PO TABS
600.0000 mg | ORAL_TABLET | Freq: Four times a day (QID) | ORAL | Status: DC
  Administered 2016-12-01 – 2016-12-02 (×5): 600 mg via ORAL
  Filled 2016-12-01 (×5): qty 1

## 2016-12-01 MED ORDER — OXYTOCIN 40 UNITS IN LACTATED RINGERS INFUSION - SIMPLE MED
2.5000 [IU]/h | INTRAVENOUS | Status: DC
  Filled 2016-12-01: qty 1000

## 2016-12-01 MED ORDER — SIMETHICONE 80 MG PO CHEW: 80.0000 mg | CHEWABLE_TABLET | ORAL | Status: DC | PRN

## 2016-12-01 MED ORDER — TETANUS-DIPHTH-ACELL PERTUSSIS 5-2.5-18.5 LF-MCG/0.5 IM SUSP
0.5000 mL | Freq: Once | INTRAMUSCULAR | Status: AC
  Administered 2016-12-01: 0.5 mL via INTRAMUSCULAR
  Filled 2016-12-01: qty 0.5

## 2016-12-01 MED ORDER — OXYCODONE-ACETAMINOPHEN 5-325 MG PO TABS: 2.0000 | ORAL_TABLET | ORAL | Status: DC | PRN

## 2016-12-01 MED ORDER — SOD CITRATE-CITRIC ACID 500-334 MG/5ML PO SOLN
30.0000 mL | ORAL | Status: DC | PRN
Start: 2016-12-01 — End: 2016-12-01

## 2016-12-01 MED ORDER — BENZOCAINE-MENTHOL 20-0.5 % EX AERO
1.0000 | INHALATION_SPRAY | CUTANEOUS | Status: DC | PRN
Start: 2016-12-01 — End: 2016-12-02

## 2016-12-01 NOTE — MAU Note (Signed)
Was 3 cm yesterday dr visit. Contractions worse and Bloody mucus started at midnight, no leaking. Baby moving well.

## 2016-12-01 NOTE — H&P (Signed)
LABOR AND DELIVERY ADMISSION HISTORY AND PHYSICAL NOTE  [name] is a 26 y.o. female G2P1001 with IUP at 8878w5d by 12 wk US presenting for SOL. Patient experiencing onset of contractions yesterday and was subsequently seen at [facility] but discharged due to no cervical change. Contractions increased in frequency and intensity prompting patient to return to [facility] this morning. She reports positive fetal movement. She denies leakage of fluid or vaginal bleeding. She presented with cervical dilation at 7 cm with bulging bag and was transferred to the L&D. Pregnancy history pertinent for single term vaginal delivery. [facility]  Prenatal History/Complications: Patient Active Problem List   Diagnosis Date Noted  . Labor and delivery, indication for care 12/01/2016  . Non-English speaking patient 11/01/2016  . Supervision of normal pregnancy, antepartum 05/22/2016    Past Medical History: Past Medical History:  Diagnosis Date  . Medical history non-contributory     Past Surgical History: Past Surgical History:  Procedure Laterality Date  . NO PAST SURGERIES      Obstetrical History: OB History    Gravida Para Term Preterm AB Living   2 1 1  0 0 1   SAB TAB Ectopic Multiple Live Births   0 0 0 0 1      Social History: Social History   Social History  . Marital status: Single    Spouse name: N/A  . Number of children: N/A  . Years of education: N/A   Social History Main Topics  . Smoking status: Never Smoker  . Smokeless tobacco: Never Used  . Alcohol use No  . Drug use: No  . Sexual activity: Yes    Birth control/ protection: OCP   Other Topics Concern  . Not on file   Social History Narrative  . No narrative on file    Family History: Family History  Problem Relation Age of Onset  . Hearing loss Neg Hx     Allergies: No Known Allergies  Prescriptions Prior to Admission  Medication Sig Dispense Refill Last Dose  . Prenatal Vit-Fe Fumarate-FA  (PNV PRENATAL PLUS MULTIVITAMIN) 27-1 MG TABS Take 1 tablet by mouth daily before breakfast. 30 tablet 11 Taking     Review of Systems   All systems reviewed and negative except as stated in HPI  currently breastfeeding. General appearance: alert, cooperative and mild distress Lungs: clear to auscultation bilaterally Heart: regular rate and rhythm Abdomen: soft, non-tender; bowel sounds normal Extremities: No calf swelling or tenderness Presentation: cephalic Fetal monitoring: Baseline 130 bpm, moderate variability, accelerations present, decelerations absent Uterine activity: Regular Q3-4 mins Dilation: 7 Effacement (%): 90 Station: -1 Exam by:: Remigio EisenmengerBenji Stanley RN   Prenatal labs: ABO, Rh: O/Positive/-- (10/16 [id number]) Antibody: Negative (10/16 0958) Rubella: Immune RPR: Non Reactive (01/08 1106)  HBsAg: Negative (10/16 0958)  HIV: Non Reactive (01/08 1106)  GBS: Negative (02/27 1346)  1 hr Glucola: Normal Genetic screening:  Normal Anatomy US: Normal  Prenatal Transfer Tool  Maternal Diabetes: No Genetic Screening: Normal Maternal Ultrasounds/Referrals: Normal Fetal Ultrasounds or other Referrals:  None Maternal Substance Abuse:  No Significant Maternal Medications:  None Significant Maternal Lab Results: Lab values include: Group B Strep negative  No results found for this or any previous visit (from the past 24 hour(s)).  Patient Active Problem List   Diagnosis Date Noted  . Non-English speaking patient 11/01/2016  . Supervision of normal pregnancy, antepartum 05/22/2016    Assessment: [name] is a 26 y.o. G2P1001 at 4678w5d  here for SOL. Making adequate progression without use of medications.  #Labor:SOL #Pain: Epidural available under #FWB: Category I #ID:  GBS negative #MOF: Bottle #MOC:Nexplanon #Circ:  N/A  Sandra Beavers, DO, PGY-1 12/01/2016, 2:39 AM   CNM attestation:  I have seen and examined this patient; I agree with above documentation  in the resident's note.   [name] is a 26 y.o. [id number] here for SOL  PE: BP 115/63 (BP Location: Left Arm)   Pulse 96   Temp 98 F (36.7 C) (Oral)   Resp 18   LMP  (LMP Unknown) Comment: Nexplanon removed 02/18/16.  Breastfeeding? Unknown  Gen: calm comfortable, NAD Resp: normal effort, no distress Abd: gravid  ROS, labs, PMH reviewed  Plan: Admit to [facility] Expectant management Anticipate SVD  [name] CNM 12/01/2016, 7:26 AM

## 2016-12-01 NOTE — Lactation Note (Signed)
This note was copied from a baby's chart. Lactation Consultation Note  Patient Name: [name] XBJYN'WToday's Date: 12/01/2016 Reason for consult: Initial assessment  Baby 8 hours old. Mom declined Elissa LovettMontagnard Jarai interpreter. Mom reports that she did not nurse her first child and she does not remember her milk coming in at all. Mom states that the baby has latched and nursed well and seems satisfied for now. Enc mom to offer lots of STS and nurse with cues. Enc mom to call for assistance as needed. Parents given LC brochure, aware of OP/BFSG and LC phone line assistance after D/C.   Maternal Data Has patient been taught Hand Expression?: Yes Does the patient have breastfeeding experience prior to this delivery?: No  Feeding Feeding Type: Breast Fed Length of feed: 15 min  LATCH Score/Interventions Latch: Grasps breast easily, tongue down, lips flanged, rhythmical sucking.  Audible Swallowing: A few with stimulation Intervention(s): Skin to skin;Hand expression  Type of Nipple: Everted at rest and after stimulation  Comfort (Breast/Nipple): Soft / non-tender     Hold (Positioning): Assistance needed to correctly position infant at breast and maintain latch. Intervention(s): Breastfeeding basics reviewed;Support Pillows;Position options;Skin to skin  LATCH Score: 8  Lactation Tools Discussed/Used     Consult Status Consult Status: Follow-up Date: 12/02/16 Follow-up type: In-patient    [name] 12/01/2016, 11:45 AM

## 2016-12-02 ENCOUNTER — Ambulatory Visit: Payer: Self-pay

## 2016-12-02 LAB — RPR: RPR Ser Ql: NONREACTIVE

## 2016-12-02 NOTE — Lactation Note (Incomplete)
This note was copied from a baby's chart. Lactation Consultation Note  Patient Name: [name] ZOXWR'U Date: 12/02/2016 Reason for consult: Follow-up assessment   Baby 37 hours old. Mom declined interpreter. Mom about to get into the shower. Mom reports that her breasts are filling and baby is latching well now. Mom states that she is hearing swallows while baby at breast. Mom reports that she will pump after baby at breast and give additional EBM with bottle. Enc mom to call for assistance as needed.   Maternal Data    Feeding Feeding Type: Breast Fed Nipple Type: Slow - flow  LATCH Score/Interventions Latch: Grasps breast easily, tongue down, lips flanged, rhythmical sucking.  Audible Swallowing: Spontaneous and intermittent  Type of Nipple: Everted at rest and after stimulation  Comfort (Breast/Nipple): Filling, red/small blisters or bruises, mild/mod discomfort  Problem noted: Mild/Moderate discomfort Interventions (Mild/moderate discomfort): Hand massage;Hand expression  Hold (Positioning): No assistance needed to correctly position infant at breast.  LATCH Score: 9  Lactation Tools Discussed/Used Pump Review: Setup, frequency, and cleaning;Milk Storage Initiated by:: bedside RN Date initiated:: 12/01/16   Consult Status Consult Status: Follow-up Date: 12/03/16 Follow-up type: In-patient    Sherlyn Hay 12/02/2016, 4:55 PM

## 2016-12-02 NOTE — Plan of Care (Signed)
Problem: Education: Goal: Knowledge of condition will improve Discharge education reviewed with patient and significant other. Patient verbalizes understanding of information.    

## 2016-12-02 NOTE — Discharge Summary (Signed)
    OB Discharge Summary     Patient Name: [name] DOB: 06/08/1991 MRN: [id number]  Date of admission: 12/01/2016 Delivering MD: Wendee Beavers   Date of discharge: 12/02/2016  Admitting diagnosis: 39.4 WEEKS CTX THICK BLOODY MUCUS Intrauterine pregnancy: [redacted]w[redacted]d     Secondary diagnosis:  Active Problems:   Labor and delivery, indication for care  Additional problems: None     Discharge diagnosis: Term Pregnancy Delivered                                                                                                Post partum procedures:None  Augmentation: AROM  Complications: None  Hospital course:  Onset of Labor With Vaginal Delivery     26 y.o. yo [id number] at [redacted]w[redacted]d was admitted in Active Labor on 12/01/2016. Patient had an uncomplicated labor course as follows:  Membrane Rupture Time/Date: 3:07 AM ,12/01/2016   Intrapartum Procedures: Episiotomy: None [1]                                         Lacerations:  None [1]  Patient had a delivery of a Viable infant. 12/01/2016  Information for the patient's newborn:  Jamelah, Sitzer Girl Azie [[id number]]  Delivery Method: Vag-Spont    Pateint had an uncomplicated postpartum course.  She is ambulating, tolerating a regular diet, passing flatus, and urinating well. Patient is discharged home in stable condition on 12/02/16.   Physical exam  Vitals:   12/01/16 0952 12/01/16 1700 12/01/16 2218 12/02/16 0613  BP: 114/72 115/84 116/68 120/64  Pulse: (!) 108 86 70 73  Resp: Temp: 98.9 F (37.2 C) 97.4 F (36.3 C) 98 F (36.7 C) 98.4 F (36.9 C)  TempSrc: Oral Oral Oral Oral   General: alert, cooperative and no distress Lochia: appropriate Uterine Fundus: firm Incision: N/A DVT Evaluation: No evidence of DVT seen on physical exam. Labs: Lab Results  Component Value Date   WBC 19.6 (H) 12/01/2016   HGB 11.3 (L) 12/01/2016   HCT 32.6 (L) 12/01/2016   MCV 66.4 (L) 12/01/2016   PLT 233 12/01/2016   No flowsheet  data found.  Discharge instruction: per After Visit Summary and "Baby and Me Booklet".  After visit meds:  Allergies as of 12/02/2016   No Known Allergies     Medication List    TAKE these medications   PNV PRENATAL PLUS MULTIVITAMIN 27-1 MG Tabs Take 1 tablet by mouth daily before breakfast.       Diet: routine diet  Activity: Advance as tolerated. Pelvic rest for 6 weeks.   Outpatient follow up:6 weeks Follow up Appt:No future appointments. Follow up Visit:No Follow-up on file.  Postpartum contraception: Nexplanon  Newborn Data: Live born female  Birth Weight: 6 lb 5.1 oz (2866 g) APGAR: 9, 9  Baby Feeding: Bottle Disposition:home with mother v rooming in    12/02/2016 Gwenevere Abbot, MD

## 2016-12-02 NOTE — Discharge Summary (Signed)
OB Discharge Summary     Patient Name: [name] DOB: [birth year] MRN: [id number]  Date of admission: 12/01/2016 Delivering MD: Wendee Beavers   Date of discharge: 12/02/2016  Admitting diagnosis: 39.4 WEEKS CTX THICK BLOODY MUCUS Intrauterine pregnancy: [redacted]w[redacted]d     Secondary diagnosis:  Active Problems:   Labor and delivery, indication for care  Additional problems: None     Discharge diagnosis: Term Pregnancy Delivered                                                                                                Post partum procedures:None  Augmentation: AROM  Complications: None  Hospital course:  Onset of Labor With Vaginal Delivery     26 y.o. yo [id number] at [redacted]w[redacted]d was admitted in Active Labor on 12/01/2016. Patient had an uncomplicated labor course as follows:  Membrane Rupture Time/Date: 3:07 AM ,12/01/2016   Intrapartum Procedures: Episiotomy: None [1]                                         Lacerations:  None [1]  Patient had a delivery of a Viable infant. 12/01/2016  Information for the patient's newborn:  Zamia, Tyminski Girl Sahej [[id number]]  Delivery Method: Vag-Spont    Pateint had an uncomplicated postpartum course.  She is ambulating, tolerating a regular diet, passing flatus, and urinating well. Patient is discharged home in stable condition on 12/02/16.   Physical exam  Vitals:   12/01/16 0952 12/01/16 1700 12/01/16 2218 12/02/16 0613  BP: 114/72 115/84 116/68 120/64  Pulse: (!) 108 86 70 73  Resp: Temp: 98.9 F (37.2 C) 97.4 F (36.3 C) 98 F (36.7 C) 98.4 F (36.9 C)  TempSrc: Oral Oral Oral Oral   General: alert, cooperative and no distress Lochia: appropriate Uterine Fundus: firm Incision: N/A DVT Evaluation: No evidence of DVT seen on physical exam. Labs: Lab Results  Component Value Date   WBC 19.6 (H) 12/01/2016   HGB 11.3 (L) 12/01/2016   HCT 32.6 (L) 12/01/2016   MCV 66.4 (L) 12/01/2016   PLT 233 12/01/2016   No flowsheet  data found.  Discharge instruction: per After Visit Summary and "Baby and Me Booklet".  After visit meds:  Allergies as of 12/02/2016   No Known Allergies     Medication List    TAKE these medications   PNV PRENATAL PLUS MULTIVITAMIN 27-1 MG Tabs Take 1 tablet by mouth daily before breakfast.       Diet: routine diet  Activity: Advance as tolerated. Pelvic rest for 6 weeks.   Outpatient follow up:6 weeks Follow up Appt:No future appointments. Follow up Visit:No Follow-up on file.  Postpartum contraception: Nexplanon  Newborn Data: Live born female  Birth Weight: 6 lb 5.1 oz (2866 g) APGAR: 9, 9  Baby Feeding: Bottle Disposition:home with mother v rooming in    I confirm that I have verified the information documented in the resident's note and that I have also personally  reperformed the physical exam and all medical decision making activities.    12/02/2016 Marylene Land, CNM

## 2016-12-03 ENCOUNTER — Ambulatory Visit: Payer: Self-pay

## 2016-12-03 NOTE — Lactation Note (Signed)
This note was copied from a baby's chart. Lactation Consultation Note: Mother reports that  Infant is feeding well on cue. Mother recently gave infant 55 ml of ebm with a bottle. Mother reports that her breast are full. She was advised to rouse infant well for feeding and do good breast massage. Mother denies having any soreness or nipple tenderness.  Mother reports that she has an electric and hand pump at home. Encouraged mother to use Symphony pump every 2-3 hours while in hospital. Mother receptive to all teaching . Mother informed to follow up with [facility] as needed by phone when she goes home. Mother made aware of all LC services.   Patient Name: [name] WGNFA'O Date: 12/03/2016 Reason for consult: Follow-up assessment   Maternal Data    Feeding Feeding Type: Breast Fed Length of feed: 10 min  LATCH Score/Interventions                      Lactation Tools Discussed/Used     Consult Status Consult Status: Complete Date: 12/03/16 Follow-up type: In-patient    Stevan Born [facility] 12/03/2016, 11:32 AM

## 2016-12-04 ENCOUNTER — Ambulatory Visit: Payer: Self-pay

## 2016-12-04 NOTE — Lactation Note (Signed)
This note was copied from a baby's chart. Lactation Consultation Note  Patient Name: [name] ZOXWR'U Date: 12/04/2016 Reason for consult: Follow-up assessment;Hyperbilirubinemia   Follow up with parents of 65 hour old infant. Phototherapy d/c and parents awaiting follow up bilirubin to determine d/c home.   Mom reports infant is eating at the breast some. She reports she is pumping every 2 hours and obtaining up to 4 oz/pumping. Mom is full and reports it is time to pump. She reports her breasts do soften with pumping. Engorgement prevention/treatment reviewed. Breast milk handling and storage reviewed. Mom reports she has a DEBP at home for use.   Enc mom to put infant to breast with each feeding followed by supplementation of EBM. Mom voiced understanding. Enc mom to call with any questions/concerns prn.    Maternal Data Formula Feeding for Exclusion: No Has patient been taught Hand Expression?: Yes  Feeding Feeding Type: Bottle Fed - Breast Milk Nipple Type: Slow - flow  LATCH Score/Interventions                      Lactation Tools Discussed/Used Pump Review: Setup, frequency, and cleaning;Milk Storage Initiated by:: Reviewed   Consult Status Consult Status: Complete Follow-up type: Call as needed    Ed Blalock 12/04/2016, 5:00 PM

## 2016-12-09 ENCOUNTER — Encounter: Payer: Medicaid Other | Admitting: Certified Nurse Midwife

## 2016-12-29 ENCOUNTER — Ambulatory Visit (INDEPENDENT_AMBULATORY_CARE_PROVIDER_SITE_OTHER): Payer: Medicaid Other | Admitting: Obstetrics

## 2016-12-29 ENCOUNTER — Encounter: Payer: Self-pay | Admitting: Obstetrics

## 2016-12-29 DIAGNOSIS — Z3009 Encounter for other general counseling and advice on contraception: Secondary | ICD-10-CM

## 2016-12-29 DIAGNOSIS — F418 Other specified anxiety disorders: Secondary | ICD-10-CM

## 2016-12-29 NOTE — Progress Notes (Signed)
Post Partum Exam  [name] is a 26 y.o. G13P2002 female who presents for a postpartum visit. She is 4 weeks postpartum following a spontaneous vaginal delivery. I have fully reviewed the prenatal and intrapartum course. The delivery was at 39 gestational weeks.  Anesthesia: none. Postpartum course has been doing well. Baby's course has been doing well. Baby is feeding by breast. Bleeding no bleeding. Bowel function is normal. Bladder function is normal. Patient is not sexually active. Contraception method is abstinence.  Pt is interested in Nexplanon. Postpartum depression screening: score of 8.  Pt states that she has been worrying a lot, feels she is not good mother.   The following portions of the patient's history were reviewed and updated as appropriate: allergies, current medications, past family history, past medical history, past social history, past surgical history and problem list.  Review of Systems A comprehensive review of systems was negative except for: Behavioral/Psych: positive for anxiety and depression    Objective:  unknown if currently breastfeeding.  General:  alert and no distress   Breasts:  inspection negative, no nipple discharge or bleeding, no masses or nodularity palpable  Lungs: clear to auscultation bilaterally  Heart:  regular rate and rhythm, S1, S2 normal, no murmur, click, rub or gallop  Abdomen: soft, non-tender; bowel sounds normal; no masses,  no organomegaly   Vulva:  normal  Vagina: normal vagina  Cervix:  no cervical motion tenderness  Corpus: normal size, contour, position, consistency, mobility, non-tender  Adnexa:  no mass, fullness, tenderness  Rectal Exam: Not performed.        Assessment:    Normal postpartum exam. Pap smear not done at today's visit.   Contraceptive Counseling and Advice.  Wants Nexplanon  Postpartum anxiety / depression - Situational, family dysfunction  Plan:   1. Contraception: Nexplanon 2. Nexplanon Rx 3.  Referred to Child psychotherapist for Depression evaluation and counseling 4. Follow up in: 2 weeks or as needed.   Nexplanon Insertion

## 2017-01-20 ENCOUNTER — Encounter: Payer: Self-pay | Admitting: Obstetrics

## 2017-01-20 ENCOUNTER — Ambulatory Visit (INDEPENDENT_AMBULATORY_CARE_PROVIDER_SITE_OTHER): Payer: Medicaid Other | Admitting: Obstetrics

## 2017-01-20 VITALS — BP 103/66 | HR 61 | Wt 121.4 lb

## 2017-01-20 DIAGNOSIS — Z3202 Encounter for pregnancy test, result negative: Secondary | ICD-10-CM

## 2017-01-20 DIAGNOSIS — Z30017 Encounter for initial prescription of implantable subdermal contraceptive: Secondary | ICD-10-CM | POA: Diagnosis not present

## 2017-01-20 LAB — POCT URINE PREGNANCY: PREG TEST UR: NEGATIVE

## 2017-01-20 NOTE — Progress Notes (Signed)
Patient is in the office for nexplanon insertion. Pt states she is breastfeeding and she has not been sexually active.

## 2017-01-20 NOTE — Progress Notes (Signed)
Nexplanon Procedure Note   PRE-OP DIAGNOSIS: desired long-term, reversible contraception ( LARC ) POST-OP DIAGNOSIS: Same  PROCEDURE: Nexplanon  placement Performing Provider: Bing Neighborsharles A. Clearance CootsHarper MD  Patient education prior to procedure, explained risk, benefits of Nexplanon, reviewed alternative options. Patient reported understanding. Gave consent to continue with procedure.   PROCEDURE:  Pregnancy Text :  Negative Site (check):      left arm         Sterile Preparation:   Betadinex3 Lot # [id number] Expiration Date 10 / 2020  Insertion site was selected 8 - 10 cm from medial epicondyle and marked along with guiding site using sterile marker. Procedure area was prepped and draped in a sterile fashion. 1% Lidocaine 1.5 ml given prior to procedure. Nexplanon  was inserted subcutaneously.Needle was removed from the insertion site. Nexplanon capsule was palpated by provider and patient to assure satisfactory placement. Dressing applied.  Followup: The patient tolerated the procedure well without complications.  Standard post-procedure care is explained and return precautions are given.  Shakesha Soltau A. Clearance CootsHarper MD

## 2017-01-24 ENCOUNTER — Encounter: Payer: Self-pay | Admitting: *Deleted

## 2017-02-06 ENCOUNTER — Ambulatory Visit (INDEPENDENT_AMBULATORY_CARE_PROVIDER_SITE_OTHER): Payer: Medicaid Other | Admitting: Obstetrics

## 2017-02-06 ENCOUNTER — Encounter: Payer: Self-pay | Admitting: Obstetrics

## 2017-02-06 VITALS — BP 105/80 | HR 70 | Wt 121.5 lb

## 2017-02-06 DIAGNOSIS — Z3046 Encounter for surveillance of implantable subdermal contraceptive: Secondary | ICD-10-CM

## 2017-02-06 DIAGNOSIS — Z3049 Encounter for surveillance of other contraceptives: Secondary | ICD-10-CM | POA: Diagnosis not present

## 2017-02-06 NOTE — Progress Notes (Signed)
Subjective:    [name] is a 26 y.o. female who presents for contraception counseling. The patient has no complaints today. The patient is sexually active. Pertinent past medical history: none.  The information documented in the HPI was reviewed and verified.  Menstrual History: OB History    Gravida Para Term Preterm AB Living   2 2 2  0 0 2   SAB TAB Ectopic Multiple Live Births   0 0 0 0 2       Patient's last menstrual period was 01/31/2017 (exact date).   Patient Active Problem List   Diagnosis Date Noted  . Labor and delivery, indication for care 12/01/2016  . Non-English speaking patient 11/01/2016  . Supervision of normal pregnancy, antepartum 05/22/2016   Past Medical History:  Diagnosis Date  . Medical history non-contributory     Past Surgical History:  Procedure Laterality Date  . NO PAST SURGERIES       Current Outpatient Prescriptions:  .  etonogestrel (NEXPLANON) 68 MG IMPL implant, 1 each by Subdermal route once., Disp: , Rfl:  .  Prenatal Vit-Fe Fumarate-FA (PNV PRENATAL PLUS MULTIVITAMIN) 27-1 MG TABS, Take 1 tablet by mouth daily before breakfast., Disp: 30 tablet, Rfl: 11 No Known Allergies  Social History  Substance Use Topics  . Smoking status: Never Smoker  . Smokeless tobacco: Never Used  . Alcohol use No    Family History  Problem Relation Age of Onset  . Hearing loss Neg Hx        Review of Systems Constitutional: negative for weight loss Genitourinary:negative for abnormal menstrual periods and vaginal discharge   Objective:   BP 105/80   Pulse 70   Wt 121 lb 8 oz (55.1 kg)   LMP 01/31/2017 (Exact Date)   Breastfeeding? Yes   BMI 23.73 kg/m    PE:      General:  Alert and no distress      Left Upper Extremity:  Nexplanon insertion site is clean.  Rod palpated, inact and non tender.  Lab Review Urine pregnancy test Labs reviewed yes Radiologic studies reviewed no  50% of 15 min visit spent on counseling and  coordination of care.    Assessment:    26 y.o., continuing Nexplanon, no contraindications.   Plan:    All questions answered. Contraception: Nexplanon. Discussed healthy lifestyle modifications. Follow up in 4 months.  Meds ordered this encounter  Medications  . etonogestrel (NEXPLANON) 68 MG IMPL implant    Sig: 1 each by Subdermal route once.   No orders of the defined types were placed in this encounter.    Patient ID: [name], female   DOB: July 16, 1991, 26 y.o.   MRN: [id number]

## 2017-02-16 ENCOUNTER — Ambulatory Visit: Payer: Self-pay | Admitting: Obstetrics

## 2019-02-09 ENCOUNTER — Other Ambulatory Visit: Payer: Self-pay | Admitting: *Deleted

## 2019-02-09 DIAGNOSIS — Z20822 Contact with and (suspected) exposure to covid-19: Secondary | ICD-10-CM

## 2019-02-11 LAB — NOVEL CORONAVIRUS, NAA: SARS-CoV-2, NAA: NOT DETECTED

## 2019-03-02 ENCOUNTER — Other Ambulatory Visit: Payer: Self-pay

## 2019-03-02 ENCOUNTER — Encounter (HOSPITAL_COMMUNITY): Payer: Self-pay

## 2019-03-02 ENCOUNTER — Emergency Department (HOSPITAL_COMMUNITY)
Admission: EM | Admit: 2019-03-02 | Discharge: 2019-03-02 | Disposition: A | Discharge status: Home / Self Care | Payer: No Typology Code available for payment source | Attending: Emergency Medicine | Admitting: Emergency Medicine

## 2019-03-02 ENCOUNTER — Emergency Department (HOSPITAL_COMMUNITY): Payer: No Typology Code available for payment source

## 2019-03-02 DIAGNOSIS — W268XXA Contact with other sharp object(s), not elsewhere classified, initial encounter: Secondary | ICD-10-CM | POA: Diagnosis not present

## 2019-03-02 DIAGNOSIS — Y990 Civilian activity done for income or pay: Secondary | ICD-10-CM | POA: Diagnosis not present

## 2019-03-02 DIAGNOSIS — S6991XA Unspecified injury of right wrist, hand and finger(s), initial encounter: Secondary | ICD-10-CM | POA: Diagnosis present

## 2019-03-02 DIAGNOSIS — S61212A Laceration without foreign body of right middle finger without damage to nail, initial encounter: Secondary | ICD-10-CM | POA: Diagnosis not present

## 2019-03-02 DIAGNOSIS — Y939 Activity, unspecified: Secondary | ICD-10-CM | POA: Diagnosis not present

## 2019-03-02 DIAGNOSIS — Z79899 Other long term (current) drug therapy: Secondary | ICD-10-CM | POA: Insufficient documentation

## 2019-03-02 DIAGNOSIS — Y9289 Other specified places as the place of occurrence of the external cause: Secondary | ICD-10-CM | POA: Diagnosis not present

## 2019-03-02 MED ORDER — LIDOCAINE HCL 2 % IJ SOLN
10.0000 mL | Freq: Once | INTRAMUSCULAR | Status: AC
  Administered 2019-03-02: 20 mg
  Filled 2019-03-02: qty 20

## 2019-03-02 NOTE — ED Triage Notes (Signed)
Pt states that she was cut on her middle finger when something fell off of a belt at [place]. Bleeding controlled. Unsure of last tetanus shot.

## 2019-03-02 NOTE — Discharge Instructions (Signed)
Your stitches will dissolve on their own and do not need to be removed.  We recommend that you keep the area clean with mild soap and warm water.  Do not apply peroxide to your wound as this will breakdown newly forming skin and prolonged wound healing.  Return to the ED for any new or concerning symptoms.

## 2019-03-02 NOTE — ED Provider Notes (Signed)
[facility]-EMERGENCY DEPT Provider Note   CSN: [id number] Arrival date & time: 03/02/19  [id number]    History   Chief Complaint Chief Complaint  Patient presents with  . Laceration    HPI [name] is a 28 y.o. female.     The history is provided by the patient. No language interpreter was used.  Laceration Location:  Finger Finger laceration location:  R middle finger Length:  1.5cm Depth:  Through dermis Quality: straight   Bleeding: controlled   Time since incident:  3 hours Laceration mechanism:  Metal edge Pain details:    Quality:  Aching   Severity:  Mild   Timing:  Constant   Progression:  Unchanged Foreign body present:  No foreign bodies Exacerbated by: palpation/pressure causes pain. Tetanus status:  Up to date Associated symptoms: no focal weakness and no numbness     Past Medical History:  Diagnosis Date  . Medical history non-contributory     Patient Active Problem List   Diagnosis Date Noted  . Labor and delivery, indication for care 12/01/2016  . Non-English speaking patient 11/01/2016  . Supervision of normal pregnancy, antepartum 05/22/2016    Past Surgical History:  Procedure Laterality Date  . NO PAST SURGERIES       OB History    Gravida  2   Para  2   Term  2   Preterm  0   AB  0   Living  2     SAB  0   TAB  0   Ectopic  0   Multiple  0   Live Births  2            Home Medications    Prior to Admission medications   Medication Sig Start Date End Date Taking? Authorizing Provider  etonogestrel (NEXPLANON) 68 MG IMPL implant 1 each by Subdermal route once.   Yes [provider]  Prenatal Vit-Fe Fumarate-FA (PNV PRENATAL PLUS MULTIVITAMIN) 27-1 MG TABS Take 1 tablet by mouth daily before breakfast. Patient not taking: Reported on 03/02/2019 02/18/16   Brock BadHarper, Charles A, MD    Family History Family History  Problem Relation Age of Onset  . Hearing loss Neg Hx     Social  History Social History   Tobacco Use  . Smoking status: Never Smoker  . Smokeless tobacco: Never Used  Substance Use Topics  . Alcohol use: No  . Drug use: No     Allergies   Patient has no known allergies.   Review of Systems Review of Systems  Skin: Positive for wound.  Neurological: Negative for focal weakness.  Ten systems reviewed and are negative for acute change, except as noted in the HPI.    Physical Exam Updated Vital Signs BP 116/83 (BP Location: Left Arm)   Temp 98.3 F (36.8 C)   Resp 16   SpO2 99%   Physical Exam Vitals signs and nursing note reviewed.  Constitutional:      General: She is not in acute distress.    Appearance: She is well-developed. She is not diaphoretic.  HENT:     Head: Normocephalic and atraumatic.  Eyes:     General: No scleral icterus.    Conjunctiva/sclera: Conjunctivae normal.  Neck:     Musculoskeletal: Normal range of motion.  Cardiovascular:     Rate and Rhythm: Normal rate and regular rhythm.     Pulses: Normal pulses.     Comments: Capillary refill brisk in  all digits of the right hand Pulmonary:     Effort: Pulmonary effort is normal. No respiratory distress.  Musculoskeletal: Normal range of motion.     Right hand: She exhibits laceration.       Hands:  Skin:    General: Skin is warm and dry.     Coloration: Skin is not pale.     Findings: No erythema or rash.  Neurological:     General: No focal deficit present.     Mental Status: She is alert and oriented to person, place, and time.     Coordination: Coordination normal.     Comments: Sensation to light touch intact.  Patient able to wiggle all fingers.  Psychiatric:        Behavior: Behavior normal.      ED Treatments / Results  Labs (all labs ordered are listed, but only abnormal results are displayed) Labs Reviewed - No data to display  EKG None  Radiology Dg Finger Middle Right  Result Date: 03/02/2019 CLINICAL DATA:  28 year old female  with injury to the right middle finger. EXAM: RIGHT MIDDLE FINGER 2+V COMPARISON:  None. FINDINGS: There is no acute fracture or dislocation. The bones are well mineralized. No arthritic changes. Mild soft tissue swelling with overlying dressing. No radiopaque foreign object. IMPRESSION: Negative. Electronically Signed   By: Anner Crete M.D.   On: 03/02/2019 01:55    Procedures Procedures (including critical care time)  LACERATION REPAIR Performed by: Antonietta Breach Authorized by: Antonietta Breach Consent: Verbal consent obtained. Risks and benefits: risks, benefits and alternatives were discussed Consent given by: patient Patient identity confirmed: provided demographic data Prepped and Draped in normal sterile fashion Wound explored  Laceration Location: R middle finger  Laceration Length: 1.5cm  No Foreign Bodies seen or palpated  Anesthesia: local infiltration  Local anesthetic: lidocaine 2% with epinephrine  Anesthetic total: 6 ml  Irrigation method: syringe Amount of cleaning: standard  Skin closure: 4-0 chromic  Number of sutures: 3  Technique: simple interrupted  Patient tolerance: Patient tolerated the procedure well with no immediate complications.   Medications Ordered in ED Medications  lidocaine (XYLOCAINE) 2 % (with pres) injection 200 mg (20 mg Infiltration Given by Other 03/02/19 0254)     Initial Impression / Assessment and Plan / ED Course  I have reviewed the triage vital signs and the nursing notes.  Pertinent labs & imaging results that were available during my care of the patient were reviewed by me and considered in my medical decision making (see chart for details).        Tdap UTD. Pressure irrigation performed. Laceration occurred < 8 hours prior to repair which was well tolerated. Pt has no comorbidities to effect normal wound healing. Discussed suture home care w pt and answered questions. Pt to follow up for wound check PRN. Patient is  hemodynamically stable w no complaints prior to discharge.     Final Clinical Impressions(s) / ED Diagnoses   Final diagnoses:  Laceration of right middle finger without foreign body without damage to nail, initial encounter    ED Discharge Orders    None       Antonietta Breach, PA-C 03/02/19 0326    Palumbo, April, MD 03/02/19 [phone]

## 2020-01-17 ENCOUNTER — Ambulatory Visit (INDEPENDENT_AMBULATORY_CARE_PROVIDER_SITE_OTHER): Payer: Medicaid Other | Admitting: Obstetrics

## 2020-01-17 ENCOUNTER — Other Ambulatory Visit: Payer: Self-pay

## 2020-01-17 ENCOUNTER — Encounter: Payer: Self-pay | Admitting: Obstetrics

## 2020-01-17 VITALS — BP 106/68 | HR 68 | Ht 60.0 in | Wt 114.9 lb

## 2020-01-17 DIAGNOSIS — Z01419 Encounter for gynecological examination (general) (routine) without abnormal findings: Secondary | ICD-10-CM

## 2020-01-17 NOTE — Progress Notes (Signed)
Pt re-scheduled for annual, cycle is currently on and pt states bleeding is heavy.

## 2020-01-17 NOTE — Progress Notes (Signed)
Patient scheduled for annual but is bleeding heavy on her period.  Appointment rescheduled.  Brock Bad, MD 01/17/2020 10:05 AM

## 2020-01-22 ENCOUNTER — Ambulatory Visit: Payer: Medicaid Other | Admitting: Obstetrics

## 2020-01-22 ENCOUNTER — Encounter: Payer: Self-pay | Admitting: Obstetrics

## 2020-01-22 ENCOUNTER — Other Ambulatory Visit: Payer: Self-pay

## 2020-01-22 VITALS — BP 102/71 | HR 67 | Wt 115.0 lb

## 2020-01-22 DIAGNOSIS — Z3046 Encounter for surveillance of implantable subdermal contraceptive: Secondary | ICD-10-CM | POA: Diagnosis not present

## 2020-01-22 MED ORDER — ETONOGESTREL 68 MG ~~LOC~~ IMPL: 68.0000 mg | DRUG_IMPLANT | Freq: Once | SUBCUTANEOUS | Status: DC

## 2020-01-22 NOTE — Progress Notes (Signed)
  Nexplanon Procedure Note    PROCEDURE: Nexplanon Removal and Reinsertion Performing Provider: Brock Bad MD  Patient education prior to procedure, explained risk, benefits of Nexplanon, reviewed alternative options. Patient reported understanding. Gave consent to continue with procedure.  Patient had Nexplanon inserted in 2018. Desires removal today w/ reinsertion  PROCEDURE:  Pregnancy Text :  not indicated Site (check):      left arm         Sterile Preparation:   Betadinex3 Lot # [id number] Expiration Date 2023 JUL 20    The patient's left arm was palpated and the implant device located. The area was prepped with Betadinex3. The distal end of the device was palpated and 1.5 cc of 1% lidocaine without epinephrine was injected. A 1.5 mm incision was made. Any fibrotic tissue was carefully dissected away using blunt and/or sharp dissection. The device was removed in an intact manner.   Insertion site was the same as the removal site. Nexplanon  was inserted subcutaneously.Needle was removed from the insertion site. Nexplanon capsule was palpated by provider and patient to assure satisfactory placement. Dressing applied.  Followup: The patient tolerated the procedure well without complications.  Standard post-procedure care is explained and return precautions are given.  Brock Bad MD 01/22/2020

## 2020-02-05 ENCOUNTER — Encounter: Payer: Self-pay | Admitting: Obstetrics

## 2020-02-05 ENCOUNTER — Ambulatory Visit: Payer: Medicaid Other | Admitting: Obstetrics

## 2020-02-05 ENCOUNTER — Other Ambulatory Visit (HOSPITAL_COMMUNITY)
Admission: RE | Admit: 2020-02-05 | Discharge: 2020-02-05 | Disposition: A | Discharge status: Home / Self Care | Payer: Medicaid Other | Source: Ambulatory Visit | Attending: Obstetrics | Admitting: Obstetrics

## 2020-02-05 ENCOUNTER — Other Ambulatory Visit: Payer: Self-pay

## 2020-02-05 VITALS — BP 108/69 | HR 66 | Ht 60.0 in | Wt 115.0 lb

## 2020-02-05 DIAGNOSIS — Z01419 Encounter for gynecological examination (general) (routine) without abnormal findings: Secondary | ICD-10-CM | POA: Insufficient documentation

## 2020-02-05 DIAGNOSIS — Z1272 Encounter for screening for malignant neoplasm of vagina: Secondary | ICD-10-CM

## 2020-02-05 DIAGNOSIS — Z0000 Encounter for general adult medical examination without abnormal findings: Secondary | ICD-10-CM

## 2020-02-05 DIAGNOSIS — N898 Other specified noninflammatory disorders of vagina: Secondary | ICD-10-CM

## 2020-02-05 NOTE — Progress Notes (Signed)
Subjective:        [name] is a 29 y.o. female here for a routine exam.  Current complaints: None.    Personal health questionnaire:  Is patient [name], have a family history of breast and/or ovarian cancer: no Is there a family history of uterine cancer diagnosed at age < 62, gastrointestinal cancer, urinary tract cancer, family member who is a Personnel officer syndrome-associated carrier: no Is the patient overweight and hypertensive, family history of diabetes, personal history of gestational diabetes, preeclampsia or PCOS: no Is patient over 31, have PCOS,  family history of premature CHD under age 50, diabetes, smoke, have hypertension or peripheral artery disease:  no At any time, has a partner hit, kicked or otherwise hurt or frightened you?: no Over the past 2 weeks, have you felt down, depressed or hopeless?: no Over the past 2 weeks, have you felt little interest or pleasure in doing things?:no   Gynecologic History No LMP recorded. Patient has had an implant. Contraception: Nexplanon Last Pap: 2017. Results were: normal Last mammogram: n/a. Results were: n/a  Obstetric History OB History  Gravida Para Term Preterm AB Living  2 2 2  0 0 2  SAB TAB Ectopic Multiple Live Births  0 0 0 0 2    # Outcome Date GA Lbr Len/2nd Weight Sex Delivery Anes PTL Lv  2 Term 12/01/16 [redacted]w[redacted]d 02:04 / 00:10 6 lb 5.1 oz (2.866 kg) F Vag-Spont None  LIV  1 Term 12/10/12 [redacted]w[redacted]d 14:21 / 00:14 7 lb 10.8 oz (3.481 kg) M Vag-Spont Local  LIV    Past Medical History:  Diagnosis Date  . Medical history non-contributory     Past Surgical History:  Procedure Laterality Date  . NO PAST SURGERIES       Current Outpatient Medications:  .  etonogestrel (NEXPLANON) 68 MG IMPL implant, 1 each by Subdermal route once., Disp: , Rfl:  .  Prenatal Vit-Fe Fumarate-FA (PNV PRENATAL PLUS MULTIVITAMIN) 27-1 MG TABS, Take 1 tablet by mouth daily before breakfast. (Patient not taking: Reported on  03/02/2019), Disp: 30 tablet, Rfl: 11  Current Facility-Administered Medications:  .  etonogestrel (NEXPLANON) implant 68 mg, 68 mg, Subdermal, Once, 03/04/2019, MD No Known Allergies  Social History   Tobacco Use  . Smoking status: Never Smoker  . Smokeless tobacco: Never Used  Substance Use Topics  . Alcohol use: No    Family History  Problem Relation Age of Onset  . Hearing loss Neg Hx       Review of Systems  Constitutional: negative for fatigue and weight loss Respiratory: negative for cough and wheezing Cardiovascular: negative for chest pain, fatigue and palpitations Gastrointestinal: negative for abdominal pain and change in bowel habits Musculoskeletal:negative for myalgias Neurological: negative for gait problems and tremors Behavioral/Psych: negative for abusive relationship, depression Endocrine: negative for temperature intolerance    Genitourinary:negative for abnormal menstrual periods, genital lesions, hot flashes, sexual problems and vaginal discharge Integument/breast: negative for breast lump, breast tenderness, nipple discharge and skin lesion(s)    Objective:       BP 108/69   Pulse 66   Ht 5' (1.524 m)   Wt 115 lb (52.2 kg)   BMI 22.46 kg/m  General:   alert  Skin:   no rash or abnormalities  Lungs:   clear to auscultation bilaterally  Heart:   regular rate and rhythm, S1, S2 normal, no murmur, click, rub or gallop  Breasts:   normal without suspicious masses,  skin or nipple changes or axillary nodes  Abdomen:  normal findings: no organomegaly, soft, non-tender and no hernia  Pelvis:  External genitalia: normal general appearance Urinary system: urethral meatus normal and bladder without fullness, nontender Vaginal: normal without tenderness, induration or masses Cervix: normal appearance Adnexa: normal bimanual exam Uterus: anteverted and non-tender, normal size   Lab Review Urine pregnancy test Labs reviewed yes Radiologic  studies reviewed no  50% of 20 min visit spent on counseling and coordination of care.   Assessment:     1. Encounter for routine gynecological examination with Papanicolaou smear of cervix Rx: - Cytology - PAP( [place])  2. Vaginal discharge Rx: - Cervicovaginal ancillary only( )   Plan:    Education reviewed: calcium supplements, depression evaluation, low fat, low cholesterol diet, safe sex/STD prevention, self breast exams and weight bearing exercise. Contraception: Nexplanon. Follow up in: 1 year.    Shelly Bombard, MD 02/05/2020 10:21 AM

## 2020-02-06 LAB — CERVICOVAGINAL ANCILLARY ONLY
Bacterial Vaginitis (gardnerella): NEGATIVE
Candida Glabrata: NEGATIVE
Candida Vaginitis: NEGATIVE
Chlamydia: NEGATIVE
Comment: NEGATIVE
Comment: NEGATIVE
Comment: NEGATIVE
Comment: NEGATIVE
Comment: NEGATIVE
Comment: NORMAL
Neisseria Gonorrhea: NEGATIVE
Trichomonas: NEGATIVE

## 2020-02-11 LAB — CYTOLOGY - PAP

## 2020-09-10 ENCOUNTER — Ambulatory Visit (HOSPITAL_COMMUNITY)
Admission: EM | Admit: 2020-09-10 | Discharge: 2020-09-10 | Disposition: A | Discharge status: Home / Self Care | Payer: BLUE CROSS/BLUE SHIELD | Attending: Student | Admitting: Student

## 2020-09-10 ENCOUNTER — Encounter (HOSPITAL_COMMUNITY): Payer: Self-pay

## 2020-09-10 ENCOUNTER — Other Ambulatory Visit: Payer: Self-pay

## 2020-09-10 DIAGNOSIS — J069 Acute upper respiratory infection, unspecified: Secondary | ICD-10-CM | POA: Insufficient documentation

## 2020-09-10 DIAGNOSIS — U071 COVID-19: Secondary | ICD-10-CM | POA: Diagnosis not present

## 2020-09-10 LAB — RESP PANEL BY RT-PCR (FLU A&B, COVID) ARPGX2
Influenza A by PCR: NEGATIVE
Influenza B by PCR: NEGATIVE
SARS Coronavirus 2 by RT PCR: POSITIVE — AB

## 2020-09-10 MED ORDER — ONDANSETRON HCL 8 MG PO TABS: 8.0000 mg | ORAL_TABLET | Freq: Three times a day (TID) | ORAL | 0 refills | Status: DC | PRN

## 2020-09-10 NOTE — ED Provider Notes (Signed)
[facility]    CSN: [id number] Arrival date & time: 09/10/20  6948      History   Chief Complaint Chief Complaint  Patient presents with  . Cough    HPI [name] is a 30 y.o. female Presenting for URI symptoms for 2 days- cough. Nausea but denies v/d/c, abd pain. Denies fevers/chills,shortness of breath, chest pain, facial pain, teeth pain, headaches, sore throat, loss of taste/smell, swollen lymph nodes, ear pain. Denies hematuria, dysuria, frequency, urgency, back pain, n/v/d/abd pain, fevers/chills, abdnormal vaginal discharge.  Denies chest pain, shortness of breath, confusion, high fevers.    HPI  Past Medical History:  Diagnosis Date  . Medical history non-contributory     Patient Active Problem List   Diagnosis Date Noted  . Labor and delivery, indication for care 12/01/2016  . Non-English speaking patient 11/01/2016  . Supervision of normal pregnancy, antepartum 05/22/2016    Past Surgical History:  Procedure Laterality Date  . NO PAST SURGERIES      OB History    Gravida  2   Para  2   Term  2   Preterm  0   AB  0   Living  2     SAB  0   IAB  0   Ectopic  0   Multiple  0   Live Births  2            Home Medications    Prior to Admission medications   Medication Sig Start Date End Date Taking? Authorizing Provider  ondansetron (ZOFRAN) 8 MG tablet Take 1 tablet (8 mg total) by mouth every 8 (eight) hours as needed for nausea or vomiting. 09/10/20  Yes Rhys Martini, PA-C  etonogestrel (NEXPLANON) 68 MG IMPL implant 1 each by Subdermal route once.    [provider]    Family History Family History  Problem Relation Age of Onset  . Healthy Mother   . Healthy Father   . Hearing loss Neg Hx     Social History Social History   Tobacco Use  . Smoking status: Never Smoker  . Smokeless tobacco: Never Used  Substance Use Topics  . Alcohol use: No  . Drug use: No     Allergies   Patient has no  known allergies.   Review of Systems Review of Systems  Constitutional: Negative for appetite change, chills and fever.  HENT: Positive for congestion. Negative for ear pain, rhinorrhea, sinus pressure, sinus pain and sore throat.   Eyes: Negative for redness and visual disturbance.  Respiratory: Positive for cough. Negative for chest tightness, shortness of breath and wheezing.   Cardiovascular: Negative for chest pain and palpitations.  Gastrointestinal: Positive for nausea. Negative for abdominal pain, constipation, diarrhea and vomiting.  Genitourinary: Negative for dysuria, frequency and urgency.  Musculoskeletal: Negative for myalgias.  Neurological: Negative for dizziness, weakness and headaches.  Psychiatric/Behavioral: Negative for confusion.  All other systems reviewed and are negative.    Physical Exam Triage Vital Signs ED Triage Vitals  Enc Vitals Group     BP 09/10/20 0853 112/70     Pulse Rate 09/10/20 0853 93     Resp 09/10/20 0853 16     Temp 09/10/20 0853 98.4 F (36.9 C)     Temp Source 09/10/20 0853 Oral     SpO2 09/10/20 0853 100 %     Weight --      Height --      Head Circumference --  Peak Flow --      Pain Score 09/10/20 0851 0     Pain Loc --      Pain Edu? --      Excl. in GC? --    No data found.  Updated Vital Signs BP 112/70 (BP Location: Right Arm)   Pulse 93   Temp 98.4 F (36.9 C) (Oral)   Resp 16   SpO2 100%   Visual Acuity Right Eye Distance:   Left Eye Distance:   Bilateral Distance:    Right Eye Near:   Left Eye Near:    Bilateral Near:     Physical Exam Vitals reviewed.  Constitutional:      General: She is not in acute distress.    Appearance: Normal appearance. She is not ill-appearing.  HENT:     Head: Normocephalic and atraumatic.     Right Ear: Hearing, tympanic membrane, ear canal and external ear normal. No swelling or tenderness. There is no impacted cerumen. No mastoid tenderness. Tympanic membrane is  not perforated, erythematous, retracted or bulging.     Left Ear: Hearing, tympanic membrane, ear canal and external ear normal. No swelling or tenderness. There is no impacted cerumen. No mastoid tenderness. Tympanic membrane is not perforated, erythematous, retracted or bulging.     Nose:     Right Sinus: No maxillary sinus tenderness or frontal sinus tenderness.     Left Sinus: No maxillary sinus tenderness or frontal sinus tenderness.     Mouth/Throat:     Mouth: Mucous membranes are moist.     Pharynx: Uvula midline. No oropharyngeal exudate or posterior oropharyngeal erythema.     Tonsils: No tonsillar exudate.  Cardiovascular:     Rate and Rhythm: Normal rate and regular rhythm.     Heart sounds: Normal heart sounds.  Pulmonary:     Breath sounds: Normal breath sounds and air entry. No wheezing, rhonchi or rales.  Chest:     Chest wall: No tenderness.  Abdominal:     General: Abdomen is flat. Bowel sounds are normal.     Tenderness: There is no abdominal tenderness. There is no right CVA tenderness, left CVA tenderness, guarding or rebound. Negative signs include Murphy's sign, Rovsing's sign and McBurney's sign.  Lymphadenopathy:     Cervical: No cervical adenopathy.  Neurological:     General: No focal deficit present.     Mental Status: She is alert and oriented to person, place, and time.  Psychiatric:        Attention and Perception: Attention and perception normal.        Mood and Affect: Mood and affect normal.        Behavior: Behavior normal. Behavior is cooperative.        Thought Content: Thought content normal.        Judgment: Judgment normal.      UC Treatments / Results  Labs (all labs ordered are listed, but only abnormal results are displayed) Labs Reviewed  RESP PANEL BY RT-PCR (FLU A&B, COVID) ARPGX2    EKG   Radiology No results found.  Procedures Procedures (including critical care time)  Medications Ordered in UC Medications - No data to  display  Initial Impression / Assessment and Plan / UC Course  I have reviewed the triage vital signs and the nursing notes.  Pertinent labs & imaging results that were available during my care of the patient were reviewed by me and considered in my medical decision making (see  chart for details).     afebrile nontachycardic nontachypneic, oxygenating well on room air.  Covid and influenza tests sent today. Isolation precautions per CDC guidelines until negative result. Symptomatic relief with OTC Mucinex, Nyquil, etc. Return precautions- new/worsening fevers/chills, shortness of breath, chest pain, abd pain, etc. zofran for nausea/vomiting.   Final Clinical Impressions(s) / UC Diagnoses   Final diagnoses:  Acute upper respiratory infection     Discharge Instructions      We are currently awaiting result of your PCR covid-19 test. This typically comes back in 1-2 days. We'll call you if the result is positive. Otherwise, the result will be sent electronically to your MyChart. You can also call this clinic and ask for your result via telephone.   Please isolate at home while awaiting these results. If your test is positive for Covid-19, continue to isolate at home for 5 days if you have mild symptoms, or a total of 10 days from symptom onset if you have more severe symptoms. If you quarantine for a shorter period of time (i.e. 5 days), make sure to wear a mask until day 10 of symptoms. Treat your symptoms at home with OTC remedies like tylenol/ibuprofen, mucinex, nyquil, etc. Seek medical attention if you develop high fevers, chest pain, shortness of breath, ear pain, facial pain, etc. Make sure to get up and move around every 2-3 hours while convalescing to help prevent blood clots. Drink plenty of fluids, and rest as much as possible.     ED Prescriptions    Medication Sig Dispense Auth. Provider   ondansetron (ZOFRAN) 8 MG tablet Take 1 tablet (8 mg total) by mouth every 8 (eight)  hours as needed for nausea or vomiting. 20 tablet Hazel Sams, PA-C     PDMP not reviewed this encounter.   Hazel Sams, PA-C 09/10/20 [phone]

## 2020-09-10 NOTE — ED Triage Notes (Signed)
Pt is here with a cough and fatigue that started Tuesday, pt has taken Tylenol to relieve discomfort.

## 2020-09-10 NOTE — Discharge Instructions (Addendum)
°  We are currently awaiting result of your PCR covid-19 test. This typically comes back in 1-2 days. We'll call you if the result is positive. Otherwise, the result will be sent electronically to your MyChart. You can also call this clinic and ask for your result via telephone.  ° °Please isolate at home while awaiting these results. If your test is positive for Covid-19, continue to isolate at home for 5 days if you have mild symptoms, or a total of 10 days from symptom onset if you have more severe symptoms. If you quarantine for a shorter period of time (i.e. 5 days), make sure to wear a mask until day 10 of symptoms. Treat your symptoms at home with OTC remedies like tylenol/ibuprofen, mucinex, nyquil, etc. Seek medical attention if you develop high fevers, chest pain, shortness of breath, ear pain, facial pain, etc. Make sure to get up and move around every 2-3 hours while convalescing to help prevent blood clots. Drink plenty of fluids, and rest as much as possible. ° °

## 2021-02-15 ENCOUNTER — Other Ambulatory Visit: Payer: Self-pay

## 2021-02-15 ENCOUNTER — Ambulatory Visit (HOSPITAL_COMMUNITY)
Admission: EM | Admit: 2021-02-15 | Discharge: 2021-02-15 | Disposition: A | Discharge status: Home / Self Care | Payer: BC Managed Care – PPO | Attending: Nurse Practitioner | Admitting: Nurse Practitioner

## 2021-02-15 ENCOUNTER — Encounter (HOSPITAL_COMMUNITY): Payer: Self-pay

## 2021-02-15 DIAGNOSIS — L299 Pruritus, unspecified: Secondary | ICD-10-CM

## 2021-02-15 DIAGNOSIS — L237 Allergic contact dermatitis due to plants, except food: Secondary | ICD-10-CM

## 2021-02-15 MED ORDER — PREDNISONE 10 MG (21) PO TBPK: ORAL_TABLET | Freq: Every day | ORAL | 0 refills | Status: DC

## 2021-02-15 MED ORDER — DEXAMETHASONE SODIUM PHOSPHATE 10 MG/ML IJ SOLN
INTRAMUSCULAR | Status: AC
  Filled 2021-02-15: qty 1

## 2021-02-15 MED ORDER — DEXAMETHASONE SODIUM PHOSPHATE 10 MG/ML IJ SOLN
10.0000 mg | Freq: Once | INTRAMUSCULAR | Status: AC
  Administered 2021-02-15: 13:00:00 10 mg via INTRAMUSCULAR

## 2021-02-15 NOTE — ED Triage Notes (Signed)
Pt reports rash in the arms, legs and torso x 1 week.

## 2021-02-15 NOTE — ED Provider Notes (Signed)
[facility]    CSN: [id number] Arrival date & time: 02/15/21  1033      History   Chief Complaint Chief Complaint  Patient presents with   Rash    HPI [name] is a 30 y.o. female.   Subjective:   [name] is a 30 y.o. female who presents for evaluation of rash. Rash started 1 week ago. Initial distribution: abdomen, bilateral ankle, bilateral arm, bilateral lower leg, and torso. Lesions are pink in color and are of raised texture.  Rash has worsened since onset. Rash is pruritic. Patient denies: arthralgia, fever, headache, irritability or  myalgia. Patient has not had previous evaluation of rash. Patient has tried several different types of OTC creams significant improvement in rash. She has also tried benadryl but states that it makes her really sleepy. Patient has not had contacts with similar rash. [place] and thinks that she may have gotten into something while walking in the wooded areas.   The following portions of the patient's history were reviewed and updated as appropriate: allergies, current medications, past family history, past medical history, past social history, past surgical history, and problem list.     Past Medical History:  Diagnosis Date   Medical history non-contributory     Patient Active Problem List   Diagnosis Date Noted   Labor and delivery, indication for care 12/01/2016   Non-English speaking patient 11/01/2016   Supervision of normal pregnancy, antepartum 05/22/2016    Past Surgical History:  Procedure Laterality Date   NO PAST SURGERIES      OB History     Gravida  2   Para  2   Term  2   Preterm  0   AB  0   Living  2      SAB  0   IAB  0   Ectopic  0   Multiple  0   Live Births  2            Home Medications    Prior to Admission medications   Medication Sig Start Date End Date Taking? Authorizing Provider   predniSONE (STERAPRED UNI-PAK 21 TAB) 10 MG (21) TBPK tablet Take by mouth daily. Take 6 tabs by mouth daily  for 2 days, then 5 tabs for 2 days, then 4 tabs for 2 days, then 3 tabs for 2 days, 2 tabs for 2 days, then 1 tab by mouth daily for 2 days 02/15/21  Yes Rhilyn Battle, Lelon Mast, FNP  etonogestrel (NEXPLANON) 68 MG IMPL implant 1 each by Subdermal route once.    [provider]  ondansetron (ZOFRAN) 8 MG tablet Take 1 tablet (8 mg total) by mouth every 8 (eight) hours as needed for nausea or vomiting. 09/10/20   Rhys Martini, PA-C    Family History Family History  Problem Relation Age of Onset   Healthy Mother    Healthy Father    Hearing loss Neg Hx     Social History Social History   Tobacco Use   Smoking status: Never   Smokeless tobacco: Never  Substance Use Topics   Alcohol use: No   Drug use: No     Allergies   Patient has no known allergies.   Review of Systems Review of Systems  Constitutional:  Negative for fever.  Gastrointestinal:  Negative for nausea and vomiting.  Skin:  Positive for rash.  All other  systems reviewed and are negative.   Physical Exam Triage Vital Signs ED Triage Vitals  Enc Vitals Group     BP 02/15/21 1117 113/74     Pulse Rate 02/15/21 1117 (!) 53     Resp 02/15/21 1117 15     Temp 02/15/21 1117 98.7 F (37.1 C)     Temp Source 02/15/21 1117 Oral     SpO2 02/15/21 1117 100 %     Weight --      Height --      Head Circumference --      Peak Flow --      Pain Score 02/15/21 1115 0     Pain Loc --      Pain Edu? --      Excl. in GC? --    No data found.  Updated Vital Signs BP 113/74 (BP Location: Right Arm)   Pulse (!) 53   Temp 98.7 F (37.1 C) (Oral)   Resp 15   SpO2 100%   Visual Acuity Right Eye Distance:   Left Eye Distance:   Bilateral Distance:    Right Eye Near:   Left Eye Near:    Bilateral Near:     Physical Exam Vitals reviewed.  Constitutional:      Appearance: Normal appearance.   HENT:     Head: Normocephalic.  Musculoskeletal:        General: Normal range of motion.     Cervical back: Normal range of motion and neck supple.  Skin:    General: Skin is warm and dry.     Findings: Rash present.     Comments: Erythematous rash noted to upper chest, abdomen bilateral arms, lower legs/ankles.   Neurological:     General: No focal deficit present.     Mental Status: She is alert and oriented to person, place, and time.  Psychiatric:        Mood and Affect: Mood normal.        Behavior: Behavior normal.     UC Treatments / Results  Labs (all labs ordered are listed, but only abnormal results are displayed) Labs Reviewed - No data to display  EKG   Radiology No results found.  Procedures Procedures (including critical care time)  Medications Ordered in UC Medications  dexamethasone (DECADRON) injection 10 mg (has no administration in time range)    Initial Impression / Assessment and Plan / UC Course  I have reviewed the triage vital signs and the nursing notes.  Pertinent labs & imaging results that were available during my care of the patient were reviewed by me and considered in my medical decision making (see chart for details).     30 yo female presenting with a erythematous pruritic rash likely due to poison oak/ivy. No fevers or systemic symptoms noted. Reassurance was given to the patient. Information on the above diagnosis was given to the patient. Decadron IM given in clinic followed by prednisone taper. Benadryl prn for itching. Aveeno baths. Skin moisturizer. Observe for signs of superimposed infection and systemic symptoms. Watch for signs of fever or worsening of the rash. Follow-up as needed.   Today's evaluation has revealed no signs of a dangerous process. Discussed diagnosis with patient and/or guardian. Patient and/or guardian aware of their diagnosis, possible red flag symptoms to watch out for and need for close follow up. Patient  and/or guardian understands verbal and written discharge instructions. Patient and/or guardian comfortable with plan and disposition.  Patient and/or  guardian has a clear mental status at this time, good insight into illness (after discussion and teaching) and has clear judgment to make decisions regarding their care  This care was provided during an unprecedented National Emergency due to the Novel Coronavirus (COVID-19) pandemic. COVID-19 infections and transmission risks place heavy strains on healthcare resources.  As this pandemic evolves, our facility, providers, and staff strive to respond fluidly, to remain operational, and to provide care relative to available resources and information. Outcomes are unpredictable and treatments are without well-defined guidelines. Further, the impact of COVID-19 on all aspects of urgent care, including the impact to patients seeking care for reasons other than COVID-19, is unavoidable during this national emergency. At this time of the global pandemic, management of patients has significantly changed, even for non-COVID positive patients given high local and regional COVID volumes at this time requiring high healthcare system and resource utilization. The standard of care for management of both COVID suspected and non-COVID suspected patients continues to change rapidly at the local, regional, national, and global levels. This patient was worked up and treated to the best available but ever changing evidence and resources available at this current time.   Documentation was completed with the aid of voice recognition software. Transcription may contain typographical errors. Final Clinical Impressions(s) / UC Diagnoses   Final diagnoses:  Allergic contact dermatitis due to plants, except food  Pruritus     Discharge Instructions      Take medications as prescribed until complete You may also take benadryl as needed for itching  If the benadryl makes you too  sleepy, then just take at night before bed  Moisturize your skin as needed. Put cool cloths on your skin. Put a baking soda paste on your skin. Stir water into baking soda until it looks like a paste. Do not scratch your skin. Avoid having things rub up against your skin. Avoid the use of soaps, perfumes, and dyes.   ED Prescriptions     Medication Sig Dispense Auth. Provider   predniSONE (STERAPRED UNI-PAK 21 TAB) 10 MG (21) TBPK tablet Take by mouth daily. Take 6 tabs by mouth daily  for 2 days, then 5 tabs for 2 days, then 4 tabs for 2 days, then 3 tabs for 2 days, 2 tabs for 2 days, then 1 tab by mouth daily for 2 days 42 tablet Lurline Idol, FNP      PDMP not reviewed this encounter.   Lurline Idol, [place] 02/15/21 1252

## 2021-02-15 NOTE — Discharge Instructions (Signed)
Take medications as prescribed until complete You may also take benadryl as needed for itching  If the benadryl makes you too sleepy, then just take at night before bed  Moisturize your skin as needed. Put cool cloths on your skin. Put a baking soda paste on your skin. Stir water into baking soda until it looks like a paste. Do not scratch your skin. Avoid having things rub up against your skin. Avoid the use of soaps, perfumes, and dyes.

## 2022-12-27 ENCOUNTER — Encounter: Payer: Self-pay | Admitting: Medical

## 2022-12-27 ENCOUNTER — Ambulatory Visit (INDEPENDENT_AMBULATORY_CARE_PROVIDER_SITE_OTHER): Payer: BC Managed Care – PPO | Admitting: Medical

## 2022-12-27 VITALS — BP 119/72 | HR 63 | Ht 60.0 in | Wt 121.4 lb

## 2022-12-27 DIAGNOSIS — Z3046 Encounter for surveillance of implantable subdermal contraceptive: Secondary | ICD-10-CM | POA: Diagnosis not present

## 2022-12-27 MED ORDER — PRENATAL VITAMIN 27-0.8 MG PO TABS: 1.0000 | ORAL_TABLET | Freq: Every day | ORAL | 11 refills | Status: DC

## 2022-12-27 NOTE — Progress Notes (Signed)
Pt presents or Nexplanon removal. Pt desires pregnancy in the next year. Requesting clomid for conception.

## 2022-12-27 NOTE — Progress Notes (Signed)
GYNECOLOGY CLINIC PROCEDURE NOTE  Nexplanon Removal Patient was given informed consent for removal of her Nexplanon.  Appropriate time out taken. Nexplanon site identified.  Area prepped in usual sterile fashon. Three ml of 1% lidocaine was used to anesthetize the area at the distal end of the implant. A small stab incision was made right beside the implant on the distal portion.  The Nexplanon rod was grasped using hemostats and removed without difficulty.  There was minimal blood loss. There were no complications.  Steri-strips were applied over the small incision.  A pressure bandage was applied to reduce any bruising.  The patient tolerated the procedure well and was given post procedure instructions.  Patient is planning to attempt conception. Rx for PNV sent.   Patient advised to have annual exam with pap smear in June or sooner if pregnant  Vonzella Nipple, PA-C 12/27/2022 11:31 AM

## 2023-02-21 ENCOUNTER — Encounter: Payer: Self-pay | Admitting: Advanced Practice Midwife

## 2023-02-21 ENCOUNTER — Ambulatory Visit (INDEPENDENT_AMBULATORY_CARE_PROVIDER_SITE_OTHER): Payer: Medicaid Other | Admitting: Advanced Practice Midwife

## 2023-02-21 ENCOUNTER — Other Ambulatory Visit (HOSPITAL_COMMUNITY)
Admission: RE | Admit: 2023-02-21 | Discharge: 2023-02-21 | Disposition: A | Discharge status: Home / Self Care | Payer: BC Managed Care – PPO | Source: Ambulatory Visit | Attending: Advanced Practice Midwife | Admitting: Advanced Practice Midwife

## 2023-02-21 VITALS — BP 103/70 | HR 77 | Ht 60.0 in | Wt 120.8 lb

## 2023-02-21 DIAGNOSIS — R102 Pelvic and perineal pain: Secondary | ICD-10-CM | POA: Diagnosis not present

## 2023-02-21 DIAGNOSIS — Z3169 Encounter for other general counseling and advice on procreation: Secondary | ICD-10-CM

## 2023-02-21 DIAGNOSIS — Z113 Encounter for screening for infections with a predominantly sexual mode of transmission: Secondary | ICD-10-CM

## 2023-02-21 DIAGNOSIS — R87612 Low grade squamous intraepithelial lesion on cytologic smear of cervix (LGSIL): Secondary | ICD-10-CM | POA: Insufficient documentation

## 2023-02-21 DIAGNOSIS — Z01419 Encounter for gynecological examination (general) (routine) without abnormal findings: Secondary | ICD-10-CM | POA: Diagnosis not present

## 2023-02-21 DIAGNOSIS — Z124 Encounter for screening for malignant neoplasm of cervix: Secondary | ICD-10-CM | POA: Insufficient documentation

## 2023-02-21 NOTE — Progress Notes (Signed)
Subjective:     [name] is a 32 y.o. female here at [facility] for a routine exam.  Current complaints: LLQ pain, intermittent, stronger ~ 1 month ago which was 3 weeks after Nexplanon removal, improved now. Desires pregnancy so is concerned since menses have not resumed.  Pt became pregnant ~ 1 month after Nexplanon removed last time so is concerned that something is wrong.  Personal health questionnaire reviewed: yes.  Do you have a primary care provider? yes Do you feel safe at home? yes    Health Maintenance Due  Topic Date Due   COVID-19 Vaccine (1) Never done   Hepatitis C Screening  Never done   PAP SMEAR-Modifier  02/05/2023     Risk factors for chronic health problems: Smoking: Alchohol/how much: Pt BMI: Body mass index is 23.59 kg/m.   Gynecologic History No LMP recorded. Contraception: none Last Pap: 02/05/20. Results were: abnormal with LSIL, pt missed messages and did not follow up. Last mammogram: n/a.   Obstetric History OB History  Gravida Para Term Preterm AB Living  2 2 2  0 0 2  SAB IAB Ectopic Multiple Live Births  0 0 0 0 2    # Outcome Date GA Lbr Len/2nd Weight Sex Delivery Anes PTL Lv  2 Term 12/01/16 [redacted]w[redacted]d 02:04 / 00:10 6 lb 5.1 oz (2.866 kg) F Vag-Spont None  LIV  1 Term 12/10/12 [redacted]w[redacted]d 14:21 / 00:14 7 lb 10.8 oz (3.481 kg) M Vag-Spont Local  LIV     The following portions of the patient's history were reviewed and updated as appropriate: allergies, current medications, past family history, past medical history, past social history, past surgical history, and problem list.  Review of Systems Pertinent items noted in HPI and remainder of comprehensive ROS otherwise negative.    Objective:  BP 103/70   Pulse 77   Ht 5' (1.524 m)   Wt 120 lb 12.8 oz (54.8 kg)   BMI 23.59 kg/m   VS reviewed, nursing note reviewed,  Constitutional: well developed, well nourished, no distress HEENT: normocephalic, thyroid without enlargement or  mass HEART: RRR, no murmurs rubs/gallops RESP: clear and equal to auscultation bilaterally in all lobes  Breast Exam:  exam performed: right breast normal without mass, skin or nipple changes or axillary nodes, left breast normal without mass, skin or nipple changes or axillary nodes Abdomen: soft Neuro: alert and oriented x 3 Skin: warm, dry Psych: affect normal Pelvic exam:Performed: Cervix pink, friable with Pap/cotton swab, visually closed, without lesion, scant white creamy discharge, vaginal walls and external genitalia normal Bimanual exam: Cervix 0/long/high, firm, anterior, neg CMT, uterus nontender, nonenlarged, adnexa without tenderness, enlargement, or mass        Assessment/Plan:   1. Encounter for annual routine gynecological examination   2. Pelvic pain in female --Sharp LLQ pain ~ 3 weeks after Nexplanon removal on 12/27/22.  --No menses since Nexplanon removal --Mild lower abdominal cramping now --Likely ovulatory pain, related to Nexplanon removal --F/U in 2 months  3. Pre-conception counseling --Pt taking iron supplement, add folic acid daily  4. Routine screening for STI (sexually transmitted infection)  - HIV antibody (with reflex) - RPR - Hepatitis C Antibody - Hepatitis B Surface AntiGEN - Cervicovaginal ancillary only( [place])  5. Screening for cervical cancer  - Cytology - PAP( [place])     Return in about 2 months (around 04/23/2023) for Gyn follow up for Abnormal Uterine Bleeding/pelvid pain.   Sharen Counter, CNM  11:04 AM

## 2023-02-21 NOTE — Progress Notes (Signed)
Pt presents for AEX.  Last PAP 02/05/20 Requesting STD testing, Declines BC  Pt c/o sharp cramping in the lower abd 3 weeks after Nexplanon removal, still has not had period since removal.

## 2023-02-22 LAB — CERVICOVAGINAL ANCILLARY ONLY
Chlamydia: NEGATIVE
Comment: NEGATIVE
Comment: NEGATIVE
Comment: NORMAL
Neisseria Gonorrhea: NEGATIVE
Trichomonas: NEGATIVE

## 2023-02-22 LAB — HIV ANTIBODY (ROUTINE TESTING W REFLEX): HIV Screen 4th Generation wRfx: NONREACTIVE

## 2023-02-22 LAB — RPR: RPR Ser Ql: NONREACTIVE

## 2023-02-22 LAB — HEPATITIS B SURFACE ANTIGEN: Hepatitis B Surface Ag: NEGATIVE

## 2023-02-22 LAB — HEPATITIS C ANTIBODY: Hep C Virus Ab: NONREACTIVE

## 2023-02-23 LAB — CYTOLOGY - PAP
Comment: NEGATIVE
High risk HPV: NEGATIVE

## 2023-04-24 ENCOUNTER — Encounter: Payer: Self-pay | Admitting: Obstetrics and Gynecology

## 2023-04-24 ENCOUNTER — Ambulatory Visit: Payer: Medicaid Other | Admitting: Obstetrics and Gynecology

## 2023-04-24 ENCOUNTER — Other Ambulatory Visit (HOSPITAL_COMMUNITY)
Admission: RE | Admit: 2023-04-24 | Discharge: 2023-04-24 | Disposition: A | Discharge status: Home / Self Care | Payer: Medicaid Other | Source: Ambulatory Visit | Attending: Obstetrics and Gynecology | Admitting: Obstetrics and Gynecology

## 2023-04-24 VITALS — BP 103/69 | HR 60 | Wt 123.0 lb

## 2023-04-24 DIAGNOSIS — N390 Urinary tract infection, site not specified: Secondary | ICD-10-CM

## 2023-04-24 DIAGNOSIS — Z3202 Encounter for pregnancy test, result negative: Secondary | ICD-10-CM | POA: Diagnosis not present

## 2023-04-24 DIAGNOSIS — R87612 Low grade squamous intraepithelial lesion on cytologic smear of cervix (LGSIL): Secondary | ICD-10-CM | POA: Diagnosis not present

## 2023-04-24 LAB — POCT URINALYSIS DIPSTICK
Bilirubin, UA: NEGATIVE
Glucose, UA: NEGATIVE
Ketones, UA: NEGATIVE
Leukocytes, UA: NEGATIVE
Nitrite, UA: POSITIVE
Protein, UA: NEGATIVE
Spec Grav, UA: 1.01 (ref 1.010–1.025)
Urobilinogen, UA: 0.2 E.U./dL
pH, UA: 7.5 (ref 5.0–8.0)

## 2023-04-24 LAB — POCT URINE PREGNANCY: Preg Test, Ur: NEGATIVE

## 2023-04-24 NOTE — Progress Notes (Signed)
32 yo P2 P2 with LMP 04/19/23 and BMI 24 with abnormal pap smear here for scheduled colposcopy. Patient reports no complaints  Past Medical History:  Diagnosis Date   Medical history non-contributory    Past Surgical History:  Procedure Laterality Date   NO PAST SURGERIES     Family History  Problem Relation Age of Onset   Healthy Mother    Healthy Father    Hearing loss Neg Hx    Social History   Tobacco Use   Smoking status: Never   Smokeless tobacco: Never  Vaping Use   Vaping status: Never Used  Substance Use Topics   Alcohol use: No   Drug use: No   ROS See pertinent in HPI. All other systems reviewed and non contributory Blood pressure 103/69, pulse 60, weight 123 lb (55.8 kg), last menstrual period 04/19/2023.  GENERAL: Well-developed, well-nourished female in no acute distress.  ABDOMEN: Soft, nontender, nondistended. No organomegaly. PELVIC: Normal external female genitalia. Vagina is pink and rugated.  Normal discharge. Normal appearing cervix. Chaperone present during the pelvic exam EXTREMITIES: No cyanosis, clubbing, or edema, 2+ distal pulses.  02/2020 LGSIL 02/2023 LGSIL negative HRHPV  A/P 32 yo with abnormal pap smear here for colposcopy  Patient given informed consent, signed copy in the chart, time out was performed.  Placed in lithotomy position. Cervix viewed with speculum and colposcope after application of acetic acid.   Colposcopy adequate?  yes Acetowhite lesions?12 and 4-7 o'clock regiove Punctation?no Mosaicism?  no Abnormal vasculature?  no Biopsies?yes 12 and 6 o'clock ECC?yes  COMMENTS: Patient was given post procedure instructions.  Discussed benefits of Gardasil vaccine  Catalina Antigua, MD

## 2023-04-24 NOTE — Progress Notes (Signed)
Pt is in office for colpo. Pt complains of nausea and excessive saliva.

## 2023-04-25 LAB — SURGICAL PATHOLOGY

## 2023-04-27 LAB — URINE CULTURE

## 2023-05-01 MED ORDER — NITROFURANTOIN MONOHYD MACRO 100 MG PO CAPS: 100.0000 mg | ORAL_CAPSULE | Freq: Two times a day (BID) | ORAL | 1 refills | Status: DC

## 2023-05-01 NOTE — Addendum Note (Signed)
Addended by: Catalina Antigua on: 05/01/2023 08:15 AM   Modules accepted: Orders

## 2023-05-19 ENCOUNTER — Ambulatory Visit: Payer: Medicaid Other | Admitting: Student

## 2023-05-19 ENCOUNTER — Encounter: Payer: Self-pay | Admitting: Student

## 2023-05-19 VITALS — BP 110/75 | HR 66 | Wt 121.6 lb

## 2023-05-19 DIAGNOSIS — Z30017 Encounter for initial prescription of implantable subdermal contraceptive: Secondary | ICD-10-CM

## 2023-05-19 DIAGNOSIS — Z3202 Encounter for pregnancy test, result negative: Secondary | ICD-10-CM

## 2023-05-19 DIAGNOSIS — Z3009 Encounter for other general counseling and advice on contraception: Secondary | ICD-10-CM

## 2023-05-19 LAB — POCT URINE PREGNANCY: Preg Test, Ur: NEGATIVE

## 2023-05-19 MED ORDER — ETONOGESTREL 68 MG ~~LOC~~ IMPL
68.0000 mg | DRUG_IMPLANT | Freq: Once | SUBCUTANEOUS | Status: AC
Start: 2023-05-19 — End: 2023-05-19
  Administered 2023-05-19: 68 mg via SUBCUTANEOUS

## 2023-05-19 NOTE — Progress Notes (Signed)
CC: Wants to discuss Birth control   Wants to discuss Nexplanon  Had in the past- did well on

## 2023-05-19 NOTE — Progress Notes (Signed)
  GYNECOLOGY CLINIC PROCEDURE NOTE Ms. [name] is a 32 y.o. [id number] here for Nexplanon insertion. Last pap smear was on LSIL w/ Negative HRHPV.  No other gynecologic concerns.  Nexplanon Insertion Procedure Patient was given informed consent, she signed consent form.  Patient does understand that irregular bleeding is a very common side effect of this medication. She was advised to have backup contraception for one week after placement. Pregnancy test in clinic today was negative.  Appropriate time out taken.  Patient's left arm was prepped and draped in the usual sterile fashion.. The ruler used to measure and mark insertion area.  Patient was prepped with alcohol swab and then injected with 3 ml of 1% lidocaine.  She was prepped with betadine, Nexplanon removed from packaging,  Device confirmed in needle, then inserted full length of needle and withdrawn per handbook instructions. Nexplanon was able to palpated in the patient's arm; patient palpated the insert herself. There was minimal blood loss.  Patient insertion site covered with guaze and a pressure bandage to reduce any bruising.  The patient tolerated the procedure well and was given post procedure instructions.    Corlis Hove, NP 05/19/2023 10:44 AM

## 2023-05-24 ENCOUNTER — Ambulatory Visit (INDEPENDENT_AMBULATORY_CARE_PROVIDER_SITE_OTHER): Payer: Medicaid Other | Admitting: Emergency Medicine

## 2023-05-24 VITALS — BP 109/75 | HR 77 | Ht 60.0 in | Wt 124.8 lb

## 2023-05-24 DIAGNOSIS — Z23 Encounter for immunization: Secondary | ICD-10-CM | POA: Diagnosis not present

## 2023-05-24 NOTE — Progress Notes (Signed)
Pt presents for HPV- 1st dose. Given in LD, tolerated well in office.

## 2023-07-24 ENCOUNTER — Ambulatory Visit: Payer: Medicaid Other

## 2023-07-24 VITALS — BP 113/75 | HR 77 | Wt 125.4 lb

## 2023-07-24 DIAGNOSIS — Z23 Encounter for immunization: Secondary | ICD-10-CM | POA: Diagnosis not present

## 2023-07-24 NOTE — Progress Notes (Signed)
Pt is in the office for 2nd HPV Gardasil injection. Administered in LD per pt request, and pt tolerated well. Advised to schedule appt for 3rd dose

## 2023-11-21 ENCOUNTER — Ambulatory Visit (INDEPENDENT_AMBULATORY_CARE_PROVIDER_SITE_OTHER): Payer: PRIVATE HEALTH INSURANCE

## 2023-11-21 VITALS — BP 121/79 | HR 78

## 2023-11-21 DIAGNOSIS — Z23 Encounter for immunization: Secondary | ICD-10-CM

## 2023-11-21 NOTE — Progress Notes (Signed)
 [name] is here for their 3rd Gardasil injection. Pt denies any issues at this time. Pt tolerated injection well. Pt has completed vaccine series.

## 2024-03-18 ENCOUNTER — Ambulatory Visit: Admitting: Family Medicine

## 2024-03-18 ENCOUNTER — Encounter: Payer: Self-pay | Admitting: Family Medicine

## 2024-03-18 VITALS — BP 101/70 | HR 62 | Ht 60.0 in | Wt 122.4 lb

## 2024-03-18 DIAGNOSIS — Z3046 Encounter for surveillance of implantable subdermal contraceptive: Secondary | ICD-10-CM

## 2024-03-18 MED ORDER — PREPLUS 27-1 MG PO TABS: 1.0000 | ORAL_TABLET | Freq: Every day | ORAL | 13 refills | Status: DC

## 2024-03-18 NOTE — Progress Notes (Signed)
 Here for Nexplanon  removal; LEFT arm.  Pt wishes to conceive.   No other concerns at this time.

## 2024-03-18 NOTE — Progress Notes (Signed)
   GYNECOLOGY OFFICE VISIT NOTE  History:   [name] is a 33 y.o. [id number] here today for Nexplanon  removal in hopes of conceiving. She denies any abnormal vaginal discharge, bleeding, pelvic pain or other concerns.    Past Medical History:  Diagnosis Date   Medical history non-contributory     Past Surgical History:  Procedure Laterality Date   NO PAST SURGERIES      The following portions of the patient's history were reviewed and updated as appropriate: allergies, current medications, past family history, past medical history, past social history, past surgical history and problem list.   Health Maintenance:  LSIL negative HPV on 02/21/2023.  Colpo with benign cells. Needs repeat Pap next month.   Review of Systems:  Pertinent items noted in HPI and remainder of comprehensive ROS otherwise negative.  Physical Exam:  BP 101/70   Pulse 62   Ht 5' (1.524 m)   Wt 122 lb 6.4 oz (55.5 kg)   LMP  (LMP Unknown)   BMI 23.90 kg/m  CONSTITUTIONAL: Well-developed, well-nourished female in no acute distress.  HEENT:  Normocephalic, atraumatic. External right and left ear normal. No scleral icterus.  NECK: Normal range of motion, supple, no masses noted on observation SKIN: No rash noted. Not diaphoretic. No erythema. No pallor. Nexplanon  in lower than expected LUE site. MUSCULOSKELETAL: Normal range of motion. No edema noted. NEUROLOGIC: Alert and oriented to person, place, and time. Normal muscle tone coordination.  PSYCHIATRIC: Normal mood and affect. Normal behavior. Normal judgment and thought content. CARDIOVASCULAR: Normal heart rate noted RESPIRATORY: Effort and breath sounds normal, no problems with respiration noted ABDOMEN: No masses noted. No other overt distention noted.   PELVIC: Deferred  Labs and Imaging No results found for this or any previous visit (from the past week). No results found.    Assessment and Plan:      1. Nexplanon  removal (Primary) - Prenatal  Vit-Fe Fumarate-FA (PREPLUS) 27-1 MG TABS; Take 1 tablet by mouth daily.  Dispense: 30 tablet; Refill: 13   Nexplanon  Removal PROCEDURE NOTE   Patient was given informed consent for removal of her Nexplanon .  Appropriate time out taken. Nexplanon  site identified.  Area prepped in usual sterile fashon. One ml of 1% lidocaine  was used to anesthetize the area at the distal end of the implant. A small stab incision was made right beside the implant on the distal portion.  The Nexplanon  rod was grasped using hemostats and removed without difficulty.  There was minimal blood loss. There were no complications.  Steri-strips were applied over the small incision.  A pressure bandage was applied to reduce any bruising.  The patient tolerated the procedure well and was given post procedure instructions.  Patient is planning to attempt conception.  Augustin JAYSON Slade 03/18/2024 11:42 AM   Routine preventative health maintenance measures emphasized. Please refer to After Visit Summary for other counseling recommendations.   Return in about 1 month (around 04/18/2024) for Pap.     Augustin JAYSON Slade, MD OB Fellow, Faculty [facility], [facility] for [facility] 03/18/2024 11:41 AM

## 2024-04-18 ENCOUNTER — Encounter: Payer: Self-pay | Admitting: Obstetrics and Gynecology

## 2024-04-18 ENCOUNTER — Ambulatory Visit: Admitting: Obstetrics and Gynecology

## 2024-04-18 ENCOUNTER — Other Ambulatory Visit (HOSPITAL_COMMUNITY)
Admission: RE | Admit: 2024-04-18 | Discharge: 2024-04-18 | Disposition: A | Discharge status: Home / Self Care | Source: Ambulatory Visit | Attending: Obstetrics and Gynecology | Admitting: Obstetrics and Gynecology

## 2024-04-18 VITALS — BP 109/74 | HR 65 | Ht 60.0 in | Wt 114.0 lb

## 2024-04-18 DIAGNOSIS — N898 Other specified noninflammatory disorders of vagina: Secondary | ICD-10-CM

## 2024-04-18 DIAGNOSIS — Z01419 Encounter for gynecological examination (general) (routine) without abnormal findings: Secondary | ICD-10-CM | POA: Diagnosis not present

## 2024-04-18 DIAGNOSIS — Z1331 Encounter for screening for depression: Secondary | ICD-10-CM

## 2024-04-18 DIAGNOSIS — Z124 Encounter for screening for malignant neoplasm of cervix: Secondary | ICD-10-CM

## 2024-04-18 MED ORDER — PRENATAL 28-0.8 MG PO TABS: 1.0000 | ORAL_TABLET | Freq: Every day | ORAL | 12 refills | Status: AC

## 2024-04-18 NOTE — Progress Notes (Signed)
 Pt presents for AEX. PAP and colpo last year. Pt has questions about PNV not being covered by insurance and trying to conceive. Pt reports brown discharge

## 2024-04-18 NOTE — Progress Notes (Signed)
 ANNUAL EXAM Patient name: [name] MRN [id number]  Date of birth: Nov 18, 1990 Chief Complaint:   No chief complaint on file.  History of Present Illness:   [name] is a 33 y.o. G31P2002  female being seen today for a routine annual exam.  Current complaints: endorses [name] vaginal discharge, needs new PNV   No LMP recorded.   The pregnancy intention screening data noted above was reviewed. Potential methods of contraception were discussed. The patient elected to proceed with No data recorded.   Gynecologic History Patient's last menstrual period was . Contraception:  Last Pap: 02/21/23. Results were: LSIL neg HPV, colpo 04/24/23 CIN 1 Last mammogram: n/a HPV vaccine: 05/24/23, 07/24/23, 11/21/23     04/18/2024    8:59 AM  Depression screen PHQ 2/9  Decreased Interest 2  Down, Depressed, Hopeless 2  PHQ - 2 Score 4  Altered sleeping 2  Tired, decreased energy 2  Change in appetite 2  Feeling bad or failure about yourself  0  Trouble concentrating 0  Moving slowly or fidgety/restless 0  Suicidal thoughts 0  PHQ-9 Score 10        04/18/2024    8:59 AM  GAD 7 : Generalized Anxiety Score  Nervous, Anxious, on Edge 0  Control/stop worrying 0  Worry too much - different things 0  Trouble relaxing 0  Restless 0  Easily annoyed or irritable 0  Afraid - awful might happen 0  Total GAD 7 Score 0     Review of Systems:   Pertinent items are noted in HPI Denies any headaches, blurred vision, fatigue, shortness of breath, chest pain, abdominal pain, abnormal vaginal discharge/itching/odor/irritation, problems with periods, bowel movements, urination, or intercourse unless otherwise stated above. Pertinent History Reviewed:  Reviewed past medical,surgical, social and family history.  Reviewed problem list, medications and allergies. Physical Assessment:   Vitals:   04/18/24 0846  BP: 109/74  Pulse: 65  Weight: 114 lb (51.7 kg)  Height: 5' (1.524 m)  Body mass index  is 22.26 kg/m.        Physical Examination:   General appearance - well appearing, and in no distress  Mental status - alert, oriented   Psych:  She has a normal mood and affect  Skin - warm and dry, normal color  Chest - effort normal, all lung fields clear to auscultation bilaterally  Heart - normal rate and regular rhythm  Neck:  midline trachea, no thyromegaly  Breasts - breasts appear normal, no suspicious masses, no skin or nipple changes or  axillary nodes  Abdomen - soft, nontender, nondistended  Pelvic - VULVA: normal appearing vulva with no masses, tenderness or lesions  VAGINA: normal appearing vagina with normal color and discharge, no lesions  CERVIX: normal appearing cervix without discharge or lesions, no CMT  Thin prep pap is done w HR HPV cotesting  Extremities:  No swelling or varicosities noted  Chaperone present for exam  No results found for this or any previous visit (from the past 24 hours).  Assessment & Plan:  1. Encounter for annual routine gynecological examination (Primary) Pap today Mammogram at 42 New rx for PNV, notify UPT pos   2. Cervical cancer screening  - Cytology - PAP( [place])  3. Vaginal discharge  - Cervicovaginal ancillary only( [place])   Labs/procedures today:   Mammogram: @ 31yo, or sooner if problems Colonoscopy: @ 31yo, or sooner if problems  No orders of the defined types were placed in this  encounter.   Meds:  Meds ordered this encounter  Medications   Prenatal 28-0.8 MG TABS    Sig: Take 1 tablet by mouth daily.    Dispense:  30 tablet    Refill:  12    Follow-up: Return in about 1 year (around 04/18/2025) for Sandra [place] Nidia Delores, FNP

## 2024-04-19 ENCOUNTER — Ambulatory Visit: Payer: Self-pay | Admitting: Obstetrics and Gynecology

## 2024-04-19 LAB — CERVICOVAGINAL ANCILLARY ONLY
Bacterial Vaginitis (gardnerella): NEGATIVE
Candida Glabrata: NEGATIVE
Candida Vaginitis: NEGATIVE
Comment: NEGATIVE
Comment: NEGATIVE
Comment: NEGATIVE

## 2024-04-30 LAB — CYTOLOGY - PAP
Comment: NEGATIVE
Diagnosis: NEGATIVE
Diagnosis: REACTIVE
High risk HPV: NEGATIVE

## 2024-06-12 ENCOUNTER — Ambulatory Visit

## 2024-06-12 DIAGNOSIS — Z3201 Encounter for pregnancy test, result positive: Secondary | ICD-10-CM | POA: Diagnosis not present

## 2024-06-12 LAB — POCT URINE PREGNANCY: Preg Test, Ur: POSITIVE — AB

## 2024-06-12 NOTE — Progress Notes (Signed)
 [name] here for a UPT. Pt had a positive upt at home. LMP is 05/10/24.     UPT in office Positive.    Reviewed medications and informed to start a PNV, if not already. Pt to follow up in 4 weeks for New OB visit.   Pt states she is going out of the [place] and will not return until 11/13. Intake scheduled for 11/17 after pt returns.

## 2024-07-22 ENCOUNTER — Other Ambulatory Visit (HOSPITAL_COMMUNITY)
Admission: RE | Admit: 2024-07-22 | Discharge: 2024-07-22 | Disposition: A | Discharge status: Home / Self Care | Source: Ambulatory Visit | Attending: Obstetrics & Gynecology | Admitting: Obstetrics & Gynecology

## 2024-07-22 ENCOUNTER — Other Ambulatory Visit (INDEPENDENT_AMBULATORY_CARE_PROVIDER_SITE_OTHER): Payer: Self-pay

## 2024-07-22 ENCOUNTER — Ambulatory Visit: Admitting: *Deleted

## 2024-07-22 VITALS — BP 107/75 | HR 65 | Wt 125.4 lb

## 2024-07-22 DIAGNOSIS — Z3480 Encounter for supervision of other normal pregnancy, unspecified trimester: Secondary | ICD-10-CM | POA: Insufficient documentation

## 2024-07-22 DIAGNOSIS — O3680X0 Pregnancy with inconclusive fetal viability, not applicable or unspecified: Secondary | ICD-10-CM

## 2024-07-22 MED ORDER — PROMETHAZINE HCL 25 MG PO TABS: 25.0000 mg | ORAL_TABLET | Freq: Four times a day (QID) | ORAL | 1 refills | Status: AC | PRN

## 2024-07-22 NOTE — Progress Notes (Unsigned)
 New OB Intake  Patient is conversing easily in English today and is aware that she may ask for an interpreter at any point.  I connected with [name]  on 07/22/24 at  9:15 AM EST by In Person Visit and verified that I am speaking with the correct person using two identifiers. Nurse is located at [facility] and pt is located at [place].  I discussed the limitations, risks, security and privacy concerns of performing an evaluation and management service by telephone and the availability of in person appointments. I also discussed with the patient that there may be a patient responsible charge related to this service. The patient expressed understanding and agreed to proceed.  I explained I am completing New OB Intake today. We discussed EDD of 02/14/25 based on LMP of 05/10/24. Pt is G3P2002. I reviewed her allergies, medications and Medical/Surgical/OB history.    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 Discussed the nature of our practice with multiple providers including residents and students as well as female and female providers. Due to the size of the practice, the delivering provider may not be the same as those providing prenatal care.   Patient is not interested in water birth.  MyChart/Babyscripts MyChart access verified. I explained pt will have some visits in office and some virtually. Babyscripts instructions given and order placed. Patient verifies receipt of registration text/e-mail. Account successfully created and app downloaded. If patient is a candidate for Optimized scheduling, add to sticky note.   Blood Pressure Cuff/Weight Scale Blood pressure cuff ordered for patient to pick-up from [employer]. Explained after first prenatal appt pt  will check weekly and document in Babyscripts. Patient does not have weight scale; patient may purchase if they desire to track weight weekly in Babyscripts.  Anatomy US  Explained first scheduled US  will be around 19 weeks. Anatomy US  scheduled for TBD at TBD.  Is patient a candidate for Babyscripts Optimization? No, due to potential for language barrier.   First visit review I reviewed new OB appt with patient. Explained pt will be seen by Olam Boards, CNM at first visit. Discussed [place] genetic screening with patient. Requests Panorama and Horizon.. Routine prenatal labs collected at today's visit.   Last Pap Diagnosis  Date Value Ref Range Status  04/18/2024   Final   - Negative for Intraepithelial Lesions or Malignancy (NILM)  04/18/2024 - Benign reactive/reparative changes  Final    [name], RN 07/22/2024  9:48 AM

## 2024-07-22 NOTE — Patient Instructions (Signed)
 Options for Doula Care in the [place]  As you review your birthing options, consider having a birth doula. A doula is trained to provide support before, during and just after you give birth. There are also postpartum doulas that help you adjust to new parenthood.  While doulas do not provide medical care, they do provide emotional, physical and educational support. A few months before your baby arrives, doulas can help answer questions, ease concerns and help you create and support your birthing plan.    Doulas can help reduce your stress and comfort you and your partner. They can help you cope with labor by helping you use breathing techniques, massage, creative labor positioning, essential oils and affirmations.   Studies show that the benefits of having a doula include:   A more positive birth experience  Fewer requests for pain-relief medication  Less likelihood of cesarean section, commonly called a c-section   Doulas are typically hired via a advertising account planner between you and the doula. We are happy to provide a list of the most active doulas in the area, all of whom are credentialed by [facility] and will not count as a visitor at your birth.  There are several options for no-cost doula care at our hospital, including:  [facility] Volunteer Doula Program Every [employer] Program A Cure 4 Moms Doula Study (available only at [employer] for Women, [place], [place] and [employer] [facility] offices)  For more information on these programs or to receive a list of doulas active in our area, please email [email or web address]  The [facility] for [employer] has a partnership with the [facility] [facility] [employer] to provide prenatal navigation for the most needed resources in our community. In order to see how we can help connect you to these resources we need consent to contact you. Please complete the very short consent using the link below:   English Link:  [email or web address].[email or web address]?[email or web address]  Spanish Link: https://[email or web address].tfaforms.[email or web address]?[email or web address]

## 2024-07-23 LAB — CERVICOVAGINAL ANCILLARY ONLY
Chlamydia: NEGATIVE
Comment: NEGATIVE
Comment: NORMAL
Neisseria Gonorrhea: NEGATIVE

## 2024-07-23 LAB — CBC/D/PLT+RPR+RH+ABO+RUBIGG...
Antibody Screen: NEGATIVE
Basophils Absolute: 0 x10E3/uL (ref 0.0–0.2)
Basos: 0 %
EOS (ABSOLUTE): 0.4 x10E3/uL (ref 0.0–0.4)
Eos: 4 %
HCV Ab: NONREACTIVE
HIV Screen 4th Generation wRfx: NONREACTIVE
Hematocrit: 39.3 % (ref 34.0–46.6)
Hemoglobin: 12.2 g/dL (ref 11.1–15.9)
Hepatitis B Surface Ag: NEGATIVE
Immature Grans (Abs): 0 x10E3/uL (ref 0.0–0.1)
Immature Granulocytes: 0 %
Lymphocytes Absolute: 2.6 x10E3/uL (ref 0.7–3.1)
Lymphs: 27 %
MCH: 23.2 pg — ABNORMAL LOW (ref 26.6–33.0)
MCHC: 31 g/dL — ABNORMAL LOW (ref 31.5–35.7)
MCV: 75 fL — ABNORMAL LOW (ref 79–97)
Monocytes Absolute: 0.8 x10E3/uL (ref 0.1–0.9)
Monocytes: 8 %
Neutrophils Absolute: 6 x10E3/uL (ref 1.4–7.0)
Neutrophils: 61 %
Platelets: 302 x10E3/uL (ref 150–450)
RBC: 5.25 x10E6/uL (ref 3.77–5.28)
RDW: 15.8 % — ABNORMAL HIGH (ref 11.7–15.4)
RPR Ser Ql: NONREACTIVE
Rh Factor: POSITIVE
Rubella Antibodies, IGG: 4.16 {index} (ref >0.99)
WBC: 9.8 x10E3/uL (ref 3.4–10.8)

## 2024-07-23 LAB — HCV INTERPRETATION

## 2024-07-23 LAB — HEMOGLOBIN A1C
Est. average glucose Bld gHb Est-mCnc: 100 mg/dL
Hgb A1c MFr Bld: 5.1 % (ref 4.8–5.6)

## 2024-07-24 LAB — URINE CULTURE, OB REFLEX

## 2024-07-24 LAB — CULTURE, OB URINE

## 2024-07-27 LAB — PANORAMA PRENATAL TEST FULL PANEL:PANORAMA TEST PLUS 5 ADDITIONAL MICRODELETIONS: FETAL FRACTION: 10.1

## 2024-07-28 LAB — HORIZON CUSTOM: REPORT SUMMARY: POSITIVE — AB

## 2024-07-29 ENCOUNTER — Encounter: Payer: Self-pay | Admitting: Obstetrics and Gynecology

## 2024-07-29 DIAGNOSIS — D582 Other hemoglobinopathies: Secondary | ICD-10-CM | POA: Insufficient documentation

## 2024-07-30 ENCOUNTER — Other Ambulatory Visit: Payer: Self-pay

## 2024-07-30 DIAGNOSIS — Z148 Genetic carrier of other disease: Secondary | ICD-10-CM

## 2024-07-31 ENCOUNTER — Encounter (HOSPITAL_COMMUNITY): Payer: Self-pay

## 2024-07-31 ENCOUNTER — Ambulatory Visit (HOSPITAL_COMMUNITY)
Admission: EM | Admit: 2024-07-31 | Discharge: 2024-07-31 | Disposition: A | Discharge status: Home / Self Care | Source: Ambulatory Visit | Attending: Emergency Medicine | Admitting: Emergency Medicine

## 2024-07-31 DIAGNOSIS — R519 Headache, unspecified: Secondary | ICD-10-CM

## 2024-07-31 MED ORDER — ACETAMINOPHEN 500 MG PO TABS: 500.0000 mg | ORAL_TABLET | Freq: Four times a day (QID) | ORAL | 0 refills | Status: AC | PRN

## 2024-07-31 MED ORDER — ACETAMINOPHEN 325 MG PO TABS
975.0000 mg | ORAL_TABLET | Freq: Once | ORAL | Status: AC
  Administered 2024-07-31: 975 mg via ORAL

## 2024-07-31 MED ORDER — ACETAMINOPHEN 325 MG PO TABS
ORAL_TABLET | ORAL | Status: AC
  Filled 2024-07-31: qty 3

## 2024-07-31 NOTE — Discharge Instructions (Addendum)
 We have given you some Tylenol  for your headache.  If the headache returns you can take it every 6 hours as needed.  This should also help with your back pain.  You can also use heat to your low back.  I have attached a list of medications that are safe to use in pregnancy.  These are available over-the-counter, without prescription.  Seek immediate care at the maternity assessment unit if you develop any vaginal bleeding, severe headache, or new concerning symptoms regarding her pregnancy.  They are open 24/7.

## 2024-07-31 NOTE — ED Provider Notes (Signed)
 [facility]    CSN: [id number] Arrival date & time: 07/31/24  1401      History   Chief Complaint Chief Complaint  Patient presents with   Headache    HPI [name] is a 33 y.o. female.   Patient presents to clinic over concern of right-sided headache that is been present for the past 3 days.  Reports pain is 6 out of 10.  Feels like she does not drink enough water.  She has not tried medications or interventions, she was unsure what to try because she is [redacted] weeks pregnant.  Has not had abdominal pain or vaginal bleeding.  Has struggled with nausea and low back pain with her pregnancy as well.  The history is provided by the patient and medical records.  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 Authorizing Provider  acetaminophen  (TYLENOL ) 500 MG tablet Take 1 tablet (500 mg total) by mouth every 6 (six) hours as needed. 07/31/24  Yes Julea Hutto  N, FNP  Prenatal 28-0.8 MG TABS Take 1 tablet by mouth daily. 04/18/24  Yes Delores Nidia CROME, FNP  promethazine  (PHENERGAN ) 25 MG tablet Take 1 tablet (25 mg total) by mouth every 6 (six) hours as needed for nausea or vomiting. 07/22/24   Constant, Peggy, MD    Family History Family History  Problem Relation Age of Onset   Healthy Mother    Healthy Father     Hearing loss Neg Hx     Social History Social History   Tobacco Use   Smoking status: Never   Smokeless tobacco: Never  Vaping Use   Vaping status: Never Used  Substance Use Topics   Alcohol use: No   Drug use: No     Allergies   Patient has no known allergies.   Review of Systems Review of Systems  Per HPI  Physical Exam Triage Vital Signs ED Triage Vitals [07/31/24 1427]  Encounter Vitals Group     BP 119/76     Girls Systolic BP Percentile      Girls Diastolic BP Percentile      Boys Systolic BP Percentile      Boys Diastolic BP Percentile      Pulse Rate 94     Resp 18     Temp 98.3 F (36.8 C)     Temp Source Oral     SpO2 98 %     Weight      Height      Head Circumference      Peak Flow  Pain Score      Pain Loc      Pain Education      Exclude from Growth Chart    No data found.  Updated Vital Signs BP 119/76 (BP Location: Left Arm)   Pulse 94   Temp 98.3 F (36.8 C) (Oral)   Resp 18   LMP 05/10/2024 (Exact Date)   SpO2 98%   Visual Acuity Right Eye Distance:   Left Eye Distance:   Bilateral Distance:    Right Eye Near:   Left Eye Near:    Bilateral Near:     Physical Exam Vitals and nursing note reviewed.  Constitutional:      Appearance: Normal appearance. She is well-developed.  HENT:     Head: Normocephalic and atraumatic.     Right Ear: External ear normal.     Left Ear: External ear normal.     Nose: Nose normal.     Mouth/Throat:     Mouth: Mucous membranes are moist.  Eyes:     Conjunctiva/sclera: Conjunctivae normal.  Cardiovascular:     Rate and Rhythm: Normal rate and regular rhythm.     Heart sounds: Normal heart sounds. No murmur heard. Pulmonary:     Effort: Pulmonary effort is normal. No respiratory distress.     Breath sounds: Normal breath sounds.  Skin:    General: Skin is warm and dry.  Neurological:     General: No focal deficit present.     Mental Status: She is alert and oriented to person,  place, and time.     GCS: GCS eye subscore is 4. GCS verbal subscore is 5. GCS motor subscore is 6.  Psychiatric:        Mood and Affect: Mood normal.        Behavior: Behavior normal.      UC Treatments / Results  Labs (all labs ordered are listed, but only abnormal results are displayed) Labs Reviewed - No data to display  EKG   Radiology No results found.  Procedures Procedures (including critical care time)  Medications Ordered in UC Medications  acetaminophen  (TYLENOL ) tablet 975 mg (975 mg Oral Given 07/31/24 1456)    Initial Impression / Assessment and Plan / UC Course  I have reviewed the triage vital signs and the nursing notes.  Pertinent labs & imaging results that were available during my care of the patient were reviewed by me and considered in my medical decision making (see chart for details).  Vitals in triage reviewed, patient is hemodynamically stable.  Lungs vesicular, heart with regular rate and rhythm.  Right-sided headache.  Normotensive.  Could be due to dehydration.  Given Tylenol  in clinic.  Provided with a list of medications that are safe to use in pregnancy.  MAU follow-up if headache persist despite treatment or if new concerning symptoms related to pregnancy develop.  Plan of care, follow-up care return precautions given, no questions at this time.    Final Clinical Impressions(s) / UC Diagnoses   Final diagnoses:  Acute intractable headache, unspecified headache type     Discharge Instructions      We have given you some Tylenol  for your headache.  If the headache returns you can take it every 6 hours as needed.  This should also help with your back pain.  You can also use heat to your low back.  I have attached a list of medications that are safe to use in pregnancy.  These are available over-the-counter,  without prescription.  Seek immediate care at the maternity assessment unit if you develop any vaginal bleeding, severe  headache, or new concerning symptoms regarding her pregnancy.  They are open 24/7.     ED Prescriptions     Medication Sig Dispense Auth. Provider   acetaminophen  (TYLENOL ) 500 MG tablet Take 1 tablet (500 mg total) by mouth every 6 (six) hours as needed. 30 tablet Dreama, Warda Mcqueary  N, FNP      PDMP not reviewed this encounter.   Dreama, Rissa Turley  N, FNP 07/31/24 [phone]

## 2024-07-31 NOTE — ED Triage Notes (Signed)
 Patient states she is 11-weeks pregnant and reports a headache for 3 days.

## 2024-08-13 ENCOUNTER — Encounter: Admitting: Advanced Practice Midwife

## 2024-08-19 ENCOUNTER — Ambulatory Visit: Attending: Obstetrics and Gynecology

## 2024-08-19 DIAGNOSIS — D582 Other hemoglobinopathies: Secondary | ICD-10-CM

## 2024-08-19 NOTE — Progress Notes (Signed)
 [facility] for Maternal Fetal Care at [facility] for Women [address], [address] Phone:  [phone]   Fax:  [phone]      In-Person Genetic Counseling Clinic Note:   I spoke with 33 y.o. [name] today to discuss her carrier screening results. She was referred by Eveline Lynwood MATSU, MD. She was accompanied by FOB .   Pregnancy History:    [id number]. EGA: [redacted]w[redacted]d by LMP. EDD: 02/14/2025. [name] has two healthy children. Denies major personal health concerns. Denies bleeding, infections, and fevers in this pregnancy. Denies using tobacco, alcohol, or street drugs in this pregnancy.   Family History:    A three-generation pedigree was created and scanned into Epic under the Media tab.  FOB reports his 62 yo brother has had a history of epilepsy since 31 yo. Seizures can occur secondary to a variety of environmental, lifestyle, and genetic factors. When epilepsy does not have an identified genetic cause, the chance that a first degree relative of someone with epilepsy will also have or develop epilepsy is approximately 2-5%. Given that FOB's brother is a second degree relative to the fetus, recurrence risk for epilepsy may be slightly elevated over the general population risk of 1%. However, without knowing the etiology of the seizures in the family, precise risk assessment is limited.  Patient ethnicity reported as Vietnamese and FOB ethnicity reported as Vietnamese. Denies [name] ancestry.  Family history not remarkable for consanguinity, individuals with birth defects, intellectual disability, autism spectrum disorder, multiple spontaneous abortions, still births, or unexplained neonatal death.   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 Therefore, she has both the usual type of hemoglobin A and the altered type, hemoglobin E (Hb A/E). Carriers  are not expected to show symptoms. Of note, she was not found to be a carrier for the other three conditions screened for (alpha thalassemia, cystic fibrosis, spinal muscular atrophy). This significantly reduces but does not eliminate the chance is a carrier for these conditions. Please see report for residual risk information.  We reviewed beta globin genes, hemoglobin, forms of beta-hemoglobinopathies and their natural histories, and the autosomal mode of inheritance. [name] will either pass down either the normal beta globin gene that produces the normal hemoglobin A or the altered beta globin gene that produces hemoglobin E. There would be a 25% risk for the pregnancy to be affected with hemoglobin E disease (HbE/E) if [name]'s reproductive partner is also a carrier. There are several types of beta-hemoglobinopathies, including hemoglobin S, hemoglobin C, hemoglobin O, and beta-thalassemia. If [name]'s reproductive partner is found to be a carrier for for a beta-hemoglobinopathy, there would be a 25% chance that this pregnancy would be affected. The type of beta-hemoglobinopathy and symptoms will vary depending on the genotype.    Based on these results, carrier screening for [name]'s reproductive partner was discussed and offered. We reviewed the benefits and limitations of carrier screening and that it can detect most but not all carriers. His blood was drawn today. He consented we contacted [name] with his results.  If he is found to be a carrier, prenatal diagnosis through amniocentesis would be available. The technical aspects, benefits, risks, and limitations of amniocentesis were reviewed including the 1 in 500 risk for miscarriage. Alternatively, testing can be completed postnatally. The [place]  Newborn Screening (NBS) program will screen all newborn babies  for beta-hemoglobinopathies and numerous other conditions. Of note, the NBS program does not screen for alpha thalassemia.    Newborn Screening. The  [place]  Newborn Screening (NBS) program will screen all newborn babies for cystic fibrosis, spinal muscular atrophy, hemoglobinopathies, and numerous other conditions.  Previous Testing Completed:  Low risk NIPS: Gizzelle previously completed Panorama noninvasive prenatal screening (NIPS) in this pregnancy. The result is low risk, consistent with a female fetus. This screening significantly reduces but does not eliminate the chance that the current pregnancy has Down syndrome (trisomy 28), trisomy 34, trisomy 99, common sex chromosome conditions, and 22q11.2 microdeletion syndrome. Please see report for residual risk information. There are many genetic conditions that cannot be detected by NIPS.    Plan of Care:   FOB carrier screening for beta-hemoglobinopathies drawn today. We will communicate the results to the couple. Declined amniocentesis. MFC anatomy ultrasound on 09/25/2024.   Informed consent was obtained. All questions were answered.   45 minutes were spent on the date of the encounter in service to the patient including preparation, face-to-face consultation, discussion of test reports and available next steps, pedigree construction, genetic risk assessment, documentation, and care coordination.    Thank you for sharing in the care of [name] with us .  Please do not hesitate to contact us  at [phone] if you have any questions.   Lauraine Bodily, MS, [facility] Certified Genetic Counselor   Genetic counseling student involved in appointment: No.

## 2024-08-21 ENCOUNTER — Encounter: Admitting: Obstetrics & Gynecology

## 2024-09-02 ENCOUNTER — Ambulatory Visit: Payer: Self-pay

## 2024-09-03 ENCOUNTER — Telehealth: Payer: Self-pay

## 2024-09-03 NOTE — Telephone Encounter (Signed)
 FOB called back, and we reviewed his carrier screening results (Then Kpa DOB 04/20/1985). He was not found to be a carrier for beta-hemoglobinopathies including hemoglobin E disease. Please see report for details. A negative result on carrier screening reduces but does not eliminate the chance of being a carrier. The chance this couple's current and future pregnancies would be affected with this condition is very low.  Lauraine Bodily, MS, [facility] Certified Genetic Counselor [facility] for [facility] [phone]

## 2024-09-03 NOTE — Telephone Encounter (Signed)
 Attempted to call patient and FOB with his negative carrier screening results. Left message with callback number.

## 2024-09-04 ENCOUNTER — Encounter: Payer: Self-pay | Admitting: Physician Assistant

## 2024-09-04 ENCOUNTER — Ambulatory Visit: Admitting: Physician Assistant

## 2024-09-04 VITALS — BP 101/63 | HR 91 | Wt 127.0 lb

## 2024-09-04 DIAGNOSIS — Z3A16 16 weeks gestation of pregnancy: Secondary | ICD-10-CM | POA: Diagnosis not present

## 2024-09-04 DIAGNOSIS — Z758 Other problems related to medical facilities and other health care: Secondary | ICD-10-CM | POA: Diagnosis not present

## 2024-09-04 DIAGNOSIS — Z603 Acculturation difficulty: Secondary | ICD-10-CM

## 2024-09-04 DIAGNOSIS — D582 Other hemoglobinopathies: Secondary | ICD-10-CM | POA: Diagnosis not present

## 2024-09-04 DIAGNOSIS — Z3480 Encounter for supervision of other normal pregnancy, unspecified trimester: Secondary | ICD-10-CM

## 2024-09-04 DIAGNOSIS — Z3482 Encounter for supervision of other normal pregnancy, second trimester: Secondary | ICD-10-CM

## 2024-09-04 NOTE — Progress Notes (Signed)
 Pt declined referral to counselor for GAD of 5.  Safe med list given to pt.   Pt declines AFP blood test today.   Pt has no other concerns.

## 2024-09-04 NOTE — Progress Notes (Signed)
 "   PRENATAL VISIT NOTE  Subjective:  [name] is a 33 y.o. G3P2002 at [redacted]w[redacted]d being seen today for her first prenatal visit for this pregnancy.  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  Patient reports no complaints.  Contractions: Not present. Vag. Bleeding: None.  Movement: Present. Denies leaking of fluid.   History is significant for 2 previous vaginal deliveries without complication.   She is planning to breastfeed. Desires unsure for contraception.   The following portions of the patient's history were reviewed and updated as appropriate: allergies, current medications, past family history, past medical history, past social history, past surgical history and problem list.   Objective:   Vitals:   09/04/24 1036  BP: 101/63  Pulse: 91  Weight: 127 lb (57.6 kg)    Fetal Status: Fetal Heart Rate (bpm): 146   Movement: Present     General:  Alert, oriented and cooperative. Patient is in no acute distress.  Skin: Skin is warm and dry. No rash noted.   Cardiovascular: Normal heart rate and rhythm noted  Respiratory: Normal respiratory effort, no problems with respiration noted. Clear to auscultation.   Abdomen: Soft, gravid, appropriate for gestational age. Normal bowel sounds. Non-tender. Pain/Pressure: Absent     Pelvic: Cervical exam deferred       Normal cervical contour, no lesions, no bleeding following pap, normal discharge  Extremities: Normal range of motion.  Edema: None  Mental Status: Normal mood and affect. Normal behavior. Normal judgment and thought content.    Indications for ASA therapy (per uptodate) One of the following: Previous pregnancy with preeclampsia, especially early onset and with an adverse outcome No Multifetal gestation No Chronic  hypertension No Type 1 or 2 diabetes mellitus No Chronic kidney disease No Autoimmune disease (antiphospholipid syndrome, systemic lupus erythematosus) No  Two or more of the following: Nulliparity No Obesity (body mass index >30 kg/m2) No Family history of preeclampsia in mother or sister No Age >=35 years No Sociodemographic characteristics (African American race, low socioeconomic level) No Personal risk factors (eg, previous pregnancy with low birth weight or small for gestational age infant, previous adverse pregnancy outcome [eg, stillbirth], interval >10 years between pregnancies) No   Assessment and Plan:  Pregnancy: G3P2002 at [redacted]w[redacted]d  1. Supervision of other normal pregnancy, antepartum (Primary) Initial labs drawn. Continue prenatal vitamins. Genetic Screening discussed: NIPS, carrier screening and AFP  Ultrasound discussed; fetal anatomic survey: 09/26/23 Problem list reviewed and updated. Reviewed Brx optimized schedule, patient agreeable The nature of [facility] with multiple MDs and other Advanced Practice Providers was explained to patient; also emphasized that residents, students are part of our team. Routine obstetric precautions reviewed.   2. [redacted] weeks gestation of pregnancy Anticipatory guidance about next visits/weeks of pregnancy given.   3. Language barrier [place] language interpreter utilized during encounter   4. 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  Please refer to After Visit Summary for other counseling recommendations.   Return in about 4 weeks (around 10/02/2024) for LOB.  Future Appointments  Date Time Provider Department Center  09/25/2024  9:00  AM [facility] PROVIDER  1 [facility]  09/25/2024  9:30 AM [facility] US2 [facility]    Jorene FORBES Moats, PA-C  "

## 2024-09-25 ENCOUNTER — Ambulatory Visit

## 2024-09-25 ENCOUNTER — Ambulatory Visit: Attending: Obstetrics & Gynecology | Admitting: Obstetrics

## 2024-09-25 VITALS — BP 94/65 | HR 89

## 2024-09-25 DIAGNOSIS — O358XX0 Maternal care for other (suspected) fetal abnormality and damage, not applicable or unspecified: Secondary | ICD-10-CM

## 2024-09-25 DIAGNOSIS — Z3A19 19 weeks gestation of pregnancy: Secondary | ICD-10-CM

## 2024-09-25 DIAGNOSIS — Z148 Genetic carrier of other disease: Secondary | ICD-10-CM

## 2024-09-25 DIAGNOSIS — O289 Unspecified abnormal findings on antenatal screening of mother: Secondary | ICD-10-CM | POA: Insufficient documentation

## 2024-09-25 DIAGNOSIS — Z363 Encounter for antenatal screening for malformations: Secondary | ICD-10-CM | POA: Insufficient documentation

## 2024-09-25 DIAGNOSIS — D582 Other hemoglobinopathies: Secondary | ICD-10-CM

## 2024-09-25 DIAGNOSIS — O280 Abnormal hematological finding on antenatal screening of mother: Secondary | ICD-10-CM | POA: Diagnosis not present

## 2024-09-25 DIAGNOSIS — Z3689 Encounter for other specified antenatal screening: Secondary | ICD-10-CM | POA: Insufficient documentation

## 2024-09-25 DIAGNOSIS — Z364 Encounter for antenatal screening for fetal growth retardation: Secondary | ICD-10-CM | POA: Diagnosis not present

## 2024-09-25 NOTE — Progress Notes (Signed)
 MFM Consult Note  [name] is currently at [redacted]w[redacted]d. She was seen today for a detailed fetal anatomy scan as her [employer] screening test indicated that she is a carrier for hemoglobin E disease.    Her partner was screened and was found not to be a carrier for beta hemoglobinopathies including hemoglobin E disease.  She denies any significant past medical history and denies any problems in her current pregnancy.    She had a cell free DNA test earlier in her pregnancy which indicated a low risk for trisomy 32, 91, and 13. A female fetus is predicted.   Sonographic findings Single intrauterine pregnancy at 19w 5d. Fetal cardiac activity:  Observed and appears normal. Presentation: Cephalic. The anatomic structures that were well seen appear normal. The anatomic survey is complete.  Fetal biometry shows the estimated fetal weight of 0 lb 11 oz,  303 grams (40%). Amniotic fluid: Within normal limits.  MVP: 5.13 cm. Placenta: Anterior. Adnexa: No abnormality visualized. Cervical length: 3 cm.  The patient was informed that anomalies may be missed due to technical limitations. If the fetus is in a suboptimal position or maternal habitus is increased, visualization of the fetus in the maternal uterus may be impaired.  She was reassured that as her partner was found not to be a carrier for beta hemoglobinopathies including hemoglobin E disease, that their child is at low risk for inheriting hemoglobin E disease.    As the fetal growth and the views of the fetal anatomy were visualized today, no further exams were scheduled in our office.  The patient stated that all of her questions were answered.   A total of 30 minutes was spent counseling and coordinating the care for this patient.  Greater than 50% of the time was spent in direct face-to-face contact.

## 2024-10-02 ENCOUNTER — Encounter: Payer: Self-pay | Admitting: Obstetrics and Gynecology

## 2024-10-02 ENCOUNTER — Ambulatory Visit: Payer: Self-pay | Admitting: Obstetrics and Gynecology

## 2024-10-02 VITALS — BP 95/66 | HR 84 | Wt 133.2 lb

## 2024-10-02 DIAGNOSIS — Z3A20 20 weeks gestation of pregnancy: Secondary | ICD-10-CM | POA: Diagnosis not present

## 2024-10-02 DIAGNOSIS — Z3480 Encounter for supervision of other normal pregnancy, unspecified trimester: Secondary | ICD-10-CM

## 2024-10-02 DIAGNOSIS — D582 Other hemoglobinopathies: Secondary | ICD-10-CM | POA: Diagnosis not present

## 2024-10-02 DIAGNOSIS — Z3482 Encounter for supervision of other normal pregnancy, second trimester: Secondary | ICD-10-CM | POA: Diagnosis not present

## 2024-10-02 NOTE — Progress Notes (Signed)
" ° °  PRENATAL VISIT NOTE  Subjective:  [name] is a 34 y.o. G3P2002 at [redacted]w[redacted]d being seen today for ongoing prenatal care.  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  Patient reports no complaints.  Contractions: Not present. Vag. Bleeding: None.  Movement: Present. Denies leaking of fluid.   The following portions of the patient's history were reviewed and updated as appropriate: allergies, current medications, past family history, past medical history, past social history, past surgical history and problem list.   Objective:   Vitals:   10/02/24 0940  BP: 95/66  Pulse: 84  Weight: 133 lb 3.2 oz (60.4 kg)    Fetal Status:  Fetal Heart Rate (bpm): 150 Fundal Height: 20 cm Movement: Present    General: Alert, oriented and cooperative. Patient is in no acute distress.  Skin: Skin is warm and dry. No rash noted.   Cardiovascular: Normal heart rate noted  Respiratory: Normal respiratory effort, no problems with respiration noted  Abdomen: Soft, gravid, appropriate for gestational age.  Pain/Pressure: Absent     Pelvic: Cervical exam deferred        Extremities: Normal range of motion.  Edema: None  Mental Status: Normal mood and affect. Normal behavior. Normal judgment and thought content.    Assessment and Plan:  Pregnancy: G3P2002 at [redacted]w[redacted]d 1. Supervision of other normal pregnancy, antepartum (Primary) BP and FHR normal Doing well, feeling regular movement    2. 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 [redacted] weeks gestation of pregnancy Anticipatory guidance regarding upcoming appts  Declines AFP    Preterm labor symptoms and general obstetric precautions including but not limited to vaginal bleeding, contractions, leaking of fluid and fetal movement were  reviewed in detail with the patient. Please refer to After Visit Summary for other counseling recommendations.     Future Appointments  Date Time Provider Department Center  10/30/2024  9:35 AM Dunn, Rollo DASEN, MD [facility] None    Nidia Daring, FNP  "

## 2024-10-02 NOTE — Progress Notes (Signed)
 Pt presents for ROB visit. No concerns

## 2024-10-30 ENCOUNTER — Encounter: Payer: Self-pay | Admitting: Obstetrics and Gynecology

## 2024-10-30 ENCOUNTER — Encounter: Admitting: Obstetrics & Gynecology
